# Patient Record
Sex: Female | Born: 1985
Health system: Southern US, Community
[De-identification: ages and names within clinical notes are randomized; demographics above are authoritative.]

## PROBLEM LIST (undated history)

## (undated) DIAGNOSIS — R0602 Shortness of breath: Secondary | ICD-10-CM

## (undated) DIAGNOSIS — I471 Supraventricular tachycardia, unspecified: Secondary | ICD-10-CM

## (undated) HISTORY — DX: Shortness of breath: R06.02

## (undated) HISTORY — DX: Supraventricular tachycardia, unspecified: I47.10

## (undated) HISTORY — PX: TONSILLECTOMY AND ADENOIDECTOMY: SHX28

## (undated) HISTORY — DX: Supraventricular tachycardia: I47.1

---

## 2017-10-11 DIAGNOSIS — E282 Polycystic ovarian syndrome: Secondary | ICD-10-CM | POA: Insufficient documentation

## 2018-05-22 DIAGNOSIS — N644 Mastodynia: Secondary | ICD-10-CM | POA: Diagnosis not present

## 2019-01-01 ENCOUNTER — Other Ambulatory Visit: Payer: Self-pay

## 2019-01-01 ENCOUNTER — Emergency Department (HOSPITAL_COMMUNITY)
Admission: EM | Admit: 2019-01-01 | Discharge: 2019-01-01 | Disposition: A | Discharge status: Home / Self Care | Payer: 59 | Attending: Emergency Medicine | Admitting: Emergency Medicine

## 2019-01-01 DIAGNOSIS — Z3A01 Less than 8 weeks gestation of pregnancy: Secondary | ICD-10-CM | POA: Diagnosis not present

## 2019-01-01 DIAGNOSIS — R0602 Shortness of breath: Secondary | ICD-10-CM | POA: Insufficient documentation

## 2019-01-01 DIAGNOSIS — O26891 Other specified pregnancy related conditions, first trimester: Secondary | ICD-10-CM | POA: Insufficient documentation

## 2019-01-01 DIAGNOSIS — I471 Supraventricular tachycardia: Secondary | ICD-10-CM | POA: Insufficient documentation

## 2019-01-01 LAB — CBC WITH DIFFERENTIAL/PLATELET
Abs Immature Granulocytes: 0.07 10*3/uL (ref 0.00–0.07)
Basophils Absolute: 0.1 10*3/uL (ref 0.0–0.1)
Basophils Relative: 0 %
Eosinophils Absolute: 0.2 10*3/uL (ref 0.0–0.5)
Eosinophils Relative: 1 %
HCT: 40.6 % (ref 36.0–46.0)
Hemoglobin: 13.5 g/dL (ref 12.0–15.0)
Immature Granulocytes: 1 %
Lymphocytes Relative: 20 %
Lymphs Abs: 3 10*3/uL (ref 0.7–4.0)
MCH: 28.9 pg (ref 26.0–34.0)
MCHC: 33.3 g/dL (ref 30.0–36.0)
MCV: 86.9 fL (ref 80.0–100.0)
Monocytes Absolute: 1 10*3/uL (ref 0.1–1.0)
Monocytes Relative: 7 %
Neutro Abs: 10.3 10*3/uL — ABNORMAL HIGH (ref 1.7–7.7)
Neutrophils Relative %: 71 %
Platelets: 354 10*3/uL (ref 150–400)
RBC: 4.67 MIL/uL (ref 3.87–5.11)
RDW: 12.1 % (ref 11.5–15.5)
WBC: 14.5 10*3/uL — ABNORMAL HIGH (ref 4.0–10.5)
nRBC: 0 % (ref 0.0–0.2)

## 2019-01-01 LAB — URINALYSIS, ROUTINE W REFLEX MICROSCOPIC
Bacteria, UA: NONE SEEN
Bilirubin Urine: NEGATIVE
Glucose, UA: NEGATIVE mg/dL
Hgb urine dipstick: NEGATIVE
Ketones, ur: NEGATIVE mg/dL
Nitrite: NEGATIVE
Protein, ur: NEGATIVE mg/dL
Specific Gravity, Urine: 1.012 (ref 1.005–1.030)
pH: 6 (ref 5.0–8.0)

## 2019-01-01 LAB — COMPREHENSIVE METABOLIC PANEL
ALT: 21 U/L (ref 0–44)
AST: 22 U/L (ref 15–41)
Albumin: 3.7 g/dL (ref 3.5–5.0)
Alkaline Phosphatase: 54 U/L (ref 38–126)
Anion gap: 11 (ref 5–15)
BUN: 11 mg/dL (ref 6–20)
CO2: 20 mmol/L — ABNORMAL LOW (ref 22–32)
Calcium: 9.2 mg/dL (ref 8.9–10.3)
Chloride: 107 mmol/L (ref 98–111)
Creatinine, Ser: 0.69 mg/dL (ref 0.44–1.00)
GFR calc Af Amer: >60 mL/min (ref >60)
GFR calc non Af Amer: >60 mL/min (ref >60)
Glucose, Bld: 149 mg/dL — ABNORMAL HIGH (ref 70–99)
Potassium: 3.5 mmol/L (ref 3.5–5.1)
Sodium: 138 mmol/L (ref 135–145)
Total Bilirubin: 0.1 mg/dL — ABNORMAL LOW (ref 0.3–1.2)
Total Protein: 7 g/dL (ref 6.5–8.1)

## 2019-01-01 LAB — TSH: TSH: 1.154 u[IU]/mL (ref 0.350–4.500)

## 2019-01-01 LAB — LACTIC ACID, PLASMA: Lactic Acid, Venous: 1.9 mmol/L (ref 0.5–1.9)

## 2019-01-01 LAB — T4, FREE: Free T4: 0.87 ng/dL (ref 0.61–1.12)

## 2019-01-01 LAB — MAGNESIUM: Magnesium: 2.1 mg/dL (ref 1.7–2.4)

## 2019-01-01 MED ORDER — METOPROLOL TARTRATE 25 MG PO TABS: 25.0000 mg | ORAL_TABLET | Freq: Two times a day (BID) | ORAL | 0 refills | Status: DC | PRN

## 2019-01-01 MED ORDER — LACTATED RINGERS IV BOLUS
1000.0000 mL | Freq: Once | INTRAVENOUS | Status: AC
  Administered 2019-01-01: 15:00:00 1000 mL via INTRAVENOUS

## 2019-01-01 NOTE — ED Notes (Signed)
Paged cards per MD

## 2019-01-01 NOTE — ED Provider Notes (Addendum)
[facility] EMERGENCY DEPARTMENT Provider Note   CSN: [id number] Arrival date & time: 01/01/19  1447     History   Chief Complaint Chief Complaint  Patient presents with  . Tachycardia    HPI Kiara Reed is a 33 y.o. female.     The history is provided by the patient.  Palpitations Palpitations quality:  Regular Onset quality:  Sudden Duration:  2 hours Timing:  Constant Progression:  Unchanged Chronicity:  Recurrent Context: not anxiety, not caffeine and not exercise   Relieved by:  Nothing Worsened by:  Nothing Ineffective treatments:  Valsalva, bed rest and breathing exercises Associated symptoms: shortness of breath   Associated symptoms: no cough, no nausea and no vomiting   Associated symptoms comment:  Shoulder pain that occurs with her SVT previously Risk factors comment:  [redacted] weeks pregnant, on progesterone (no IVF), sees a fertility clinic  Patient is symptomatic likely SVT and saw cardiologist as a teenager.  She has not seen a cardiologist since she became an adult.  She is [redacted] weeks pregnant, pregnancy confirmed by ultrasound at fertility clinic.  No past medical history on file.  There are no active problems to display for this patient.   ** The histories are not reviewed yet. Please review them in the "History" navigator section and refresh this SmartLink.   OB History   No obstetric history on file.      Home Medications    Prior to Admission medications   Medication Sig Start Date End Date Taking? Authorizing Provider  metoprolol tartrate (LOPRESSOR) 25 MG tablet Take 1 tablet (25 mg total) by mouth 2 (two) times daily as needed (Palpitations unrelieved by  baring down and valsalva maneuver). 01/01/19   Chester Holstein, MD    Family History No family history on file.  Social History Social History   Tobacco Use  . Smoking status: Not on file  Substance Use Topics  . Alcohol use: Not on file  . Drug use: Not on  file     Allergies   Latex   Review of Systems Review of Systems  Constitutional: Negative for fever.  Respiratory: Positive for shortness of breath. Negative for cough.   Cardiovascular: Positive for palpitations.  Gastrointestinal: Negative for abdominal pain, nausea and vomiting.  Genitourinary: Negative for vaginal bleeding, vaginal discharge and vaginal pain.  Skin: Negative for rash and wound.  All other systems reviewed and are negative.    Physical Exam Updated Vital Signs BP 102/68   Pulse 85   Resp (!) 25   Ht 5\' 2"  (1.575 m)   Wt 90.7 kg   SpO2 97%   BMI 36.58 kg/m   Physical Exam Vitals signs and nursing note reviewed.  Constitutional:      General: She is not in acute distress.    Appearance: She is well-developed.  HENT:     Head: Normocephalic and atraumatic.  Eyes:     Conjunctiva/sclera: Conjunctivae normal.  Neck:     Musculoskeletal: Neck supple.  Cardiovascular:     Rate and Rhythm: Regular rhythm. Tachycardia present.     Pulses: Normal pulses.     Heart sounds: No murmur.  Pulmonary:     Effort: Pulmonary effort is normal. No respiratory distress.     Breath sounds: Normal breath sounds. No wheezing.  Abdominal:     General: Bowel sounds are normal. There is no distension.     Palpations: Abdomen is soft.     Tenderness: There  is no abdominal tenderness.     Comments: Slightly gravid abdomen  Musculoskeletal: Normal range of motion.  Skin:    General: Skin is warm and dry.  Neurological:     General: No focal deficit present.     Mental Status: She is alert and oriented to person, place, and time.      ED Treatments / Results  Labs (all labs ordered are listed, but only abnormal results are displayed) Labs Reviewed  CBC WITH DIFFERENTIAL/PLATELET - Abnormal; Notable for the following components:      Result Value   WBC 14.5 (*)    Neutro Abs 10.3 (*)    All other components within normal limits  COMPREHENSIVE METABOLIC  PANEL - Abnormal; Notable for the following components:   CO2 20 (*)    Glucose, Bld 149 (*)    Total Bilirubin 0.1 (*)    All other components within normal limits  URINALYSIS, ROUTINE W REFLEX MICROSCOPIC - Abnormal; Notable for the following components:   Leukocytes,Ua TRACE (*)    All other components within normal limits  LACTIC ACID, PLASMA  TSH  T4, FREE  MAGNESIUM    EKG EKG Interpretation  Date/Time:  Wednesday January 01 2019 15:07:28 EST Ventricular Rate:  100 PR Interval:    QRS Duration: 81 QT Interval:  301 QTC Calculation: 389 R Axis:   65 Text Interpretation: Sinus tachycardia Atrial premature complex Low voltage, precordial leads Minimal ST depression, diffuse leads svt resolved Otherwise no significant change Confirmed by Melene PlanFloyd, Dan [phone]([id number]) on 01/01/2019 3:52:46 PM   Radiology No results found.  Procedures .Cardioversion  Date/Time: 01/01/2019 3:00 PM Performed by: Chester HolsteinVaithi, Edmonia Gonser, MD Authorized by: Melene PlanFloyd, Dan, DO   Consent:    Consent obtained:  Verbal   Consent given by:  Patient   Alternatives discussed:  No treatment and rate-control medication Pre-procedure details:    Cardioversion basis:  Emergent   Rhythm:  Supraventricular tachycardia   Electrode placement:  Anterior-posterior Patient sedated: No Post-procedure details:    Patient status:  Awake   Patient tolerance of procedure:  Tolerated well, no immediate complications Comments:     Patient was converted back to sinus rhythm with valsalva x2. Placed on 2L [place] O2 for preoxygenation and weaned without difficulty.     (including critical care time)  Medications Ordered in ED Medications  lactated ringers bolus 1,000 mL (0 mLs Intravenous Stopped 01/01/19 1742)     Initial Impression / Assessment and Plan / ED Course  I have reviewed the triage vital signs and the nursing notes.  Pertinent labs & imaging results that were available during my care of the patient were reviewed  by me and considered in my medical decision making (see chart for details).        Kiara Reed is a 33 y.o. female G1, P0 who is [redacted] weeks pregnant presenting today for recurrent SVT that has always previously broken with Valsalva.  Today, breathing exercise and Valsalva did not help.  She has never taken medications for this.  Within minutes of arrival, patient was given 2 L nasal cannula for symptomatic relief, IV obtained, Valsalva done x2 with immediate decrease in heartbeat from 182 to 90.  EKGs obtained before and after.  Initial EKG had shown SVT, EKGs after procedure.  Sinus rhythm.  Labs and differential diagnosis sent for electrolyte abnormality, anemia, sepsis, UTI.  LR bolus ordered.  Patient has remained in sinus rhythm on the emergency department.  There was no significant  electrolyte abnormality, anemia, signs of infection, UTI.  Cardiology consulted, and they recommend metoprolol as needed for tachycardia that is unresolved with Valsalva at home.  They will arrange follow-up.  Hydration encouraged.  Follow-up recommended with her OB/GYN as needed.  Care of patient discussed with supervising attending.  Final Clinical Impressions(s) / ED Diagnoses   Final diagnoses:  SVT (supraventricular tachycardia) [facility])    ED Discharge Orders         Ordered    metoprolol tartrate (LOPRESSOR) 25 MG tablet  2 times daily PRN     01/01/19 1731           Julianne Rice, MD 01/01/19 2036    Deno Etienne, DO 01/01/19 2216

## 2019-01-01 NOTE — ED Notes (Signed)
Pt verbalized understanding of discharge instructions and denies any further questions at this time. Mother present at bedside. Pt provided with a wheelchair.

## 2019-01-01 NOTE — ED Notes (Signed)
Pt is [redacted] weeks pregnant- through fertility clinic, has had 2 ultrasounds to confirm--  Has a hx of svt, episode this weekend Saturday-- laid on floor and was able to get it to stop, today it started at 1309-- has not stopped -- rate over 180 for 2 hours.

## 2019-01-02 ENCOUNTER — Encounter: Payer: Self-pay | Admitting: Cardiology

## 2019-01-02 ENCOUNTER — Ambulatory Visit (INDEPENDENT_AMBULATORY_CARE_PROVIDER_SITE_OTHER): Payer: 59 | Admitting: Cardiology

## 2019-01-02 VITALS — BP 112/62 | HR 88 | Ht 62.0 in | Wt 207.8 lb

## 2019-01-02 DIAGNOSIS — I471 Supraventricular tachycardia: Secondary | ICD-10-CM | POA: Diagnosis not present

## 2019-01-02 NOTE — Patient Instructions (Signed)
Medication Instructions:  - Your physician recommends that you continue on your current medications as directed. Please refer to the Current Medication list given to you today.  *If you need a refill on your cardiac medications before your next appointment, please call your pharmacy*  Lab Work: - none ordered If you have labs (blood work) drawn today and your tests are completely normal, you will receive your results only by: Marland Kitchen MyChart Message (if you have MyChart) OR . A paper copy in the mail If you have any lab test that is abnormal or we need to change your treatment, we will call you to review the results.  Testing/Procedures: - Your physician has requested that you have an echocardiogram. Echocardiography is a painless test that uses sound waves to create images of your heart. It provides your doctor with information about the size and shape of your heart and how well your heart's chambers and valves are working. This procedure takes approximately one hour. There are no restrictions for this procedure.  - You have been referred to: Dr. Cristopher Peru for SVT - Dr. Tanna Furry scheduler from our [place] office will be in touch with you regarding an appointment.  Follow-Up: At [facility], you and your health needs are our priority.  As part of our continuing mission to provide you with exceptional heart care, we have created designated Provider Care Teams.  These Care Teams include your primary Cardiologist (physician) and Advanced Practice Providers (APPs -  Physician Assistants and Nurse Practitioners) who all work together to provide you with the care you need, when you need it.  Your next appointment:   6 months  The format for your next appointment:   In Person  Provider:   Kate Sable, MD  Other Instructions - n/a   Echocardiogram An echocardiogram is a procedure that uses painless sound waves (ultrasound) to produce an image of the heart. Images from an  echocardiogram can provide important information about:  Signs of coronary artery disease (CAD).  Aneurysm detection. An aneurysm is a weak or damaged part of an artery wall that bulges out from the normal force of blood pumping through the body.  Heart size and shape. Changes in the size or shape of the heart can be associated with certain conditions, including heart failure, aneurysm, and CAD.  Heart muscle function.  Heart valve function.  Signs of a past heart attack.  Fluid buildup around the heart.  Thickening of the heart muscle.  A tumor or infectious growth around the heart valves. Tell a health care provider about:  Any allergies you have.  All medicines you are taking, including vitamins, herbs, eye drops, creams, and over-the-counter medicines.  Any blood disorders you have.  Any surgeries you have had.  Any medical conditions you have.  Whether you are pregnant or may be pregnant. What are the risks? Generally, this is a safe procedure. However, problems may occur, including:  Allergic reaction to dye (contrast) that may be used during the procedure. What happens before the procedure? No specific preparation is needed. You may eat and drink normally. What happens during the procedure?   An IV tube may be inserted into one of your veins.  You may receive contrast through this tube. A contrast is an injection that improves the quality of the pictures from your heart.  A gel will be applied to your chest.  A wand-like tool (transducer) will be moved over your chest. The gel will help to transmit the  sound waves from the transducer.  The sound waves will harmlessly bounce off of your heart to allow the heart images to be captured in real-time motion. The images will be recorded on a computer. The procedure may vary among health care providers and hospitals. What happens after the procedure?  You may return to your normal, everyday life, including diet,  activities, and medicines, unless your health care provider tells you not to do that. Summary  An echocardiogram is a procedure that uses painless sound waves (ultrasound) to produce an image of the heart.  Images from an echocardiogram can provide important information about the size and shape of your heart, heart muscle function, heart valve function, and fluid buildup around your heart.  You do not need to do anything to prepare before this procedure. You may eat and drink normally.  After the echocardiogram is completed, you may return to your normal, everyday life, unless your health care provider tells you not to do that. This information is not intended to replace advice given to you by your health care provider. Make sure you discuss any questions you have with your health care provider. Document Released: 02/04/2000 Document Revised: 05/30/2018 Document Reviewed: 03/11/2016 Elsevier Patient Education  2020 [employer].

## 2019-01-02 NOTE — Progress Notes (Signed)
Cardiology Office Note:    Date:  01/02/2019   ID:  Kiara GainsGayla Craun, DOB [birth year], MRN [id number]  PCP:  Carren Rangarter, Danielle, PA-C  Cardiologist:  Debbe OdeaBrian Agbor-Etang, MD  Electrophysiologist:  None   Referring MD: Carren Rangarter, Danielle, PA-C   Chief Complaint  Patient presents with  . Other    ED follow up. Patient c./o SOB.  Meds reviewed verbally with patient.    Kiara Reed is a 33 y.o. female who is being seen today for the evaluation of palpitations at the request of Carren RangCarter, Danielle, [place].  History of Present Illness:    Kiara GainsGayla Wray is a 33 y.o. female with no significant past medical history, currently [redacted] weeks pregnant, who presents due to palpitations.  Patient [place] [facility] yesterday when she noticed palpitations.  She usually tries to take a deep breath with improvement in symptoms but this time symptoms persisted for over 2 hours.  [facility]  Her initial EKG showed SVT with heart rate of 200.  Valsalva maneuvers were performed with improvement in heart rate to about 90 bpm.  Rhythm was sinus.  Electrolytes were normal, there was no signs of infection or, anemia.  Repeat EKG showed sinus with heart rate 100 bpm.  She was prescribed Lopressor to take as needed for palpitations.  Patient states having history of palpitations starting at age 33.  She saw cardiology in the past and was told she would grow out of it.  Her prior symptoms [facility]  Symptoms lasted a couple of minutes.  Usually she takes a deep breath with improvement in symptoms.  History reviewed. No pertinent past medical history.  Past Surgical History:  Procedure Laterality Date  . TONSILLECTOMY AND ADENOIDECTOMY      Current Medications: Current Meds  Medication Sig  . metoprolol tartrate (LOPRESSOR) 25 MG tablet Take 1 tablet (25 mg total) by mouth 2 (two) times daily as needed (Palpitations  unrelieved by  baring down and valsalva maneuver).     Allergies:   Latex   Social History   Socioeconomic History  . Marital status: Married    Spouse name: Not on file  . Number of children: Not on file  . Years of education: Not on file  . Highest education level: Not on file  Occupational History  . Not on file  Social Needs  . Financial resource strain: Not on file  . Food insecurity    Worry: Not on file    Inability: Not on file  . Transportation needs    Medical: Not on file    Non-medical: Not on file  Tobacco Use  . Smoking status: Never Smoker  . Smokeless tobacco: Never Used  Substance and Sexual Activity  . Alcohol use: Never    Frequency: Never  . Drug use: Never  . Sexual activity: Not on file  Lifestyle  . Physical activity    Days per week: Not on file    Minutes per session: Not on file  . Stress: Not on file  Relationships  . Social Musicianconnections    Talks on phone: Not on file    Gets together: Not on file    Attends religious service: Not on file    Active member of club or organization: Not on file    Attends meetings of clubs or organizations: Not on file    Relationship status:  Not on file  Other Topics Concern  . Not on file  Social History Narrative  . Not on file     Family History: The patient denies any family history of cardiac disease.  ROS:   Please see the history of present illness.     All other systems reviewed and are negative.  EKGs/Labs/Other Studies Reviewed:    The following studies were reviewed today: EKG in the emergency room reviewed by myself showed SVT with heart rate 200.  EKG:  EKG is  ordered today.  The ekg ordered today demonstrates normal sinus rhythm, heart rate 88.  Recent Labs: 01/01/2019: ALT 21; BUN 11; Creatinine, Ser 0.69; Hemoglobin 13.5; Magnesium 2.1; Platelets 354; Potassium 3.5; Sodium 138; TSH 1.154  Recent Lipid Panel No results found for: CHOL, TRIG, HDL, CHOLHDL, VLDL, LDLCALC,  LDLDIRECT  Physical Exam:    VS:  BP 112/62 (BP Location: Left Arm, Patient Position: Sitting, Cuff Size: Normal)   Pulse 88   Ht  (1.575 m)   Wt 207 lb 12 oz (94.2 kg)   BMI 38.00 kg/m     Wt Readings from Last 3 Encounters:  01/02/19 207 lb 12 oz (94.2 kg)  01/01/19 200 lb (90.7 kg)     GEN:  Well nourished, well developed in no acute distress HEENT: Normal NECK: No JVD; No carotid bruits LYMPHATICS: No lymphadenopathy CARDIAC: RRR, no murmurs, rubs, gallops RESPIRATORY:  Clear to auscultation without rales, wheezing or rhonchi  ABDOMEN: Soft, non-tender, non-distended MUSCULOSKELETAL:  No edema; No deformity  SKIN: Warm and dry NEUROLOGIC:  Alert and oriented x 3 PSYCHIATRIC:  Normal affect   ASSESSMENT:   33 year old female, [redacted] weeks pregnant presenting with symptoms of palpitations.  Found to have SVT which resolved with Valsalva maneuver.  1. SVT (supraventricular tachycardia) (HCC)    PLAN:    In order of problems listed above:  1. We will get an echocardiogram to rule out any structural abnormalities.  Patient educated on Valsalva maneuvers to help whenever palpitations arise.  Due to risk of beta-blocker crossing placenta, risk of fetal growth restriction, I advised patient to take metoprolol only if symptoms persist after for Valsalva  maneuvers are performed.  We will refer patient to electrophysiology for possible ablation consideration after her child is born.  She is due in June 2021.  Total encounter time more than 60 minutes  Greater than 50% was spent in counseling and coordination of care with the patient  This note was generated in part or whole with voice recognition software. Voice recognition is usually quite accurate but there are transcription errors that can and very often do occur. I apologize for any typographical errors that were not detected and corrected.  Medication Adjustments/Labs and Tests Ordered: Current medicines are reviewed at  length with the patient today.  Concerns regarding medicines are outlined above.  Orders Placed This Encounter  Procedures  . Ambulatory referral to Cardiac Electrophysiology  . EKG 12-Lead  . ECHOCARDIOGRAM COMPLETE   No orders of the defined types were placed in this encounter.   Patient Instructions  Medication Instructions:  - Your physician recommends that you continue on your current medications as directed. Please refer to the Current Medication list given to you today.  *If you need a refill on your cardiac medications before your next appointment, please call your pharmacy*  Lab Work: - none ordered If you have labs (blood work) drawn today and your tests are completely normal, you will receive  your results only by: Marland Kitchen MyChart Message (if you have MyChart) OR . A paper copy in the mail If you have any lab test that is abnormal or we need to change your treatment, we will call you to review the results.  Testing/Procedures: - Your physician has requested that you have an echocardiogram. Echocardiography is a painless test that uses sound waves to create images of your heart. It provides your doctor with information about the size and shape of your heart and how well your heart's chambers and valves are working. This procedure takes approximately one hour. There are no restrictions for this procedure.  - You have been referred to: Dr. Lewayne Bunting for SVT - Dr. Lubertha Basque scheduler from our [place] office will be in touch with you regarding an appointment.  Follow-Up: At [facility], you and your health needs are our priority.  As part of our continuing mission to provide you with exceptional heart care, we have created designated Provider Care Teams.  These Care Teams include your primary Cardiologist (physician) and Advanced Practice Providers (APPs -  Physician Assistants and Nurse Practitioners) who all work together to provide you with the care you need, when you need it.   Your next appointment:   6 months  The format for your next appointment:   In Person  Provider:   Debbe Odea, MD  Other Instructions - n/a   Echocardiogram An echocardiogram is a procedure that uses painless sound waves (ultrasound) to produce an image of the heart. Images from an echocardiogram can provide important information about:  Signs of coronary artery disease (CAD).  Aneurysm detection. An aneurysm is a weak or damaged part of an artery wall that bulges out from the normal force of blood pumping through the body.  Heart size and shape. Changes in the size or shape of the heart can be associated with certain conditions, including heart failure, aneurysm, and CAD.  Heart muscle function.  Heart valve function.  Signs of a past heart attack.  Fluid buildup around the heart.  Thickening of the heart muscle.  A tumor or infectious growth around the heart valves. Tell a health care provider about:  Any allergies you have.  All medicines you are taking, including vitamins, herbs, eye drops, creams, and over-the-counter medicines.  Any blood disorders you have.  Any surgeries you have had.  Any medical conditions you have.  Whether you are pregnant or may be pregnant. What are the risks? Generally, this is a safe procedure. However, problems may occur, including:  Allergic reaction to dye (contrast) that may be used during the procedure. What happens before the procedure? No specific preparation is needed. You may eat and drink normally. What happens during the procedure?   An IV tube may be inserted into one of your veins.  You may receive contrast through this tube. A contrast is an injection that improves the quality of the pictures from your heart.  A gel will be applied to your chest.  A wand-like tool (transducer) will be moved over your chest. The gel will help to transmit the sound waves from the transducer.  The sound waves will  harmlessly bounce off of your heart to allow the heart images to be captured in real-time motion. The images will be recorded on a computer. The procedure may vary among health care providers and hospitals. What happens after the procedure?  You may return to your normal, everyday life, including diet, activities, and medicines, unless your health  care provider tells you not to do that. Summary  An echocardiogram is a procedure that uses painless sound waves (ultrasound) to produce an image of the heart.  Images from an echocardiogram can provide important information about the size and shape of your heart, heart muscle function, heart valve function, and fluid buildup around your heart.  You do not need to do anything to prepare before this procedure. You may eat and drink normally.  After the echocardiogram is completed, you may return to your normal, everyday life, unless your health care provider tells you not to do that. This information is not intended to replace advice given to you by your health care provider. Make sure you discuss any questions you have with your health care provider. Document Released: 02/04/2000 Document Revised: 05/30/2018 Document Reviewed: 03/11/2016 Elsevier Patient Education  2020 [place], Kate Sable, MD  01/02/2019 10:57 AM    [place]

## 2019-01-10 ENCOUNTER — Encounter: Payer: Self-pay | Admitting: Obstetrics & Gynecology

## 2019-01-10 ENCOUNTER — Other Ambulatory Visit (HOSPITAL_COMMUNITY)
Admission: RE | Admit: 2019-01-10 | Discharge: 2019-01-10 | Disposition: A | Discharge status: Home / Self Care | Payer: 59 | Source: Ambulatory Visit | Attending: Obstetrics & Gynecology | Admitting: Obstetrics & Gynecology

## 2019-01-10 ENCOUNTER — Ambulatory Visit (INDEPENDENT_AMBULATORY_CARE_PROVIDER_SITE_OTHER): Payer: 59 | Admitting: Obstetrics & Gynecology

## 2019-01-10 ENCOUNTER — Other Ambulatory Visit: Payer: Self-pay

## 2019-01-10 VITALS — BP 120/70 | Wt 208.0 lb

## 2019-01-10 DIAGNOSIS — Z3490 Encounter for supervision of normal pregnancy, unspecified, unspecified trimester: Secondary | ICD-10-CM

## 2019-01-10 DIAGNOSIS — Z124 Encounter for screening for malignant neoplasm of cervix: Secondary | ICD-10-CM | POA: Insufficient documentation

## 2019-01-10 DIAGNOSIS — Z3A09 9 weeks gestation of pregnancy: Secondary | ICD-10-CM

## 2019-01-10 DIAGNOSIS — Z3491 Encounter for supervision of normal pregnancy, unspecified, first trimester: Secondary | ICD-10-CM

## 2019-01-10 DIAGNOSIS — Z1379 Encounter for other screening for genetic and chromosomal anomalies: Secondary | ICD-10-CM

## 2019-01-10 NOTE — Progress Notes (Signed)
01/10/2019   Chief Complaint: Missed period  Transfer of Care Patient: [facility]  History of Present Illness: Kiara Reed is a 33 y.o. G1P0 [redacted]w[redacted]d based on Patient's last menstrual period was 11/02/2018. with an Estimated Date of Delivery: 08/09/19, with the above CC.   Her periods were: rare, oligomenorrhea related to PCOS.    Pt underwent 2 rounds of Letrozole for fertility w success She was using no method when she conceived.  She has Negative signs or symptoms of nausea/vomiting of pregnancy. She has Negative signs or symptoms of miscarriage or preterm labor She identifies Negative Zika risk factors for her and her partner On any different medications around the time she conceived/early pregnancy: No  History of varicella: Yes   ROS: A 12-point review of systems was performed and negative, except as stated in the above HPI.  OBGYN History: As per HPI. OB History  Gravida Para Term Preterm AB Living  1            SAB TAB Ectopic Multiple Live Births               # Outcome Date GA Lbr Len/2nd Weight Sex Delivery Anes PTL Lv  1 Current             Any issues with any prior pregnancies: not applicable Any prior children are healthy, doing well, without any problems or issues: not applicable History of pap smears: Yes. Last pap smear 2019. Abnormal: no  History of STIs: No   Past Medical History: Past Medical History:  Diagnosis Date  . SVT (supraventricular tachycardia) (HCC)     Past Surgical History: Past Surgical History:  Procedure Laterality Date  . TONSILLECTOMY AND ADENOIDECTOMY      Family History:  Family History  Problem Relation Age of Onset  . Cancer Mother   . Cervical cancer Mother   . Bladder Cancer Mother    She denies any female cancers, bleeding or blood clotting disorders.  She denies any history of mental retardation, birth defects or genetic disorders in her or the FOB's history  Social History:  Social History   Socioeconomic  History  . Marital status: Married    Spouse name: Not on file  . Number of children: Not on file  . Years of education: Not on file  . Highest education level: Not on file  Occupational History  . Not on file  Social Needs  . Financial resource strain: Not on file  . Food insecurity    Worry: Not on file    Inability: Not on file  . Transportation needs    Medical: Not on file    Non-medical: Not on file  Tobacco Use  . Smoking status: Never Smoker  . Smokeless tobacco: Never Used  Substance and Sexual Activity  . Alcohol use: Never    Frequency: Never  . Drug use: Never  . Sexual activity: Yes    Birth control/protection: None  Lifestyle  . Physical activity    Days per week: Not on file    Minutes per session: Not on file  . Stress: Not on file  Relationships  . Social Musician on phone: Not on file    Gets together: Not on file    Attends religious service: Not on file    Active member of club or organization: Not on file    Attends meetings of clubs or organizations: Not on file    Relationship status: Not on  file  . Intimate partner violence    Fear of current or ex partner: Not on file    Emotionally abused: Not on file    Physically abused: Not on file    Forced sexual activity: Not on file  Other Topics Concern  . Not on file  Social History Narrative  . Not on file   Any pets in the household: no  Allergy: Allergies  Allergen Reactions  . Latex Rash    Current Outpatient Medications:  Current Outpatient Medications:  .  metoprolol tartrate (LOPRESSOR) 25 MG tablet, Take 1 tablet (25 mg total) by mouth 2 (two) times daily as needed (Palpitations unrelieved by  baring down and valsalva maneuver)., Disp: 30 tablet, Rfl: 0   Physical Exam:   BP 120/70   Wt 208 lb (94.3 kg)   LMP 11/02/2018   BMI 38.04 kg/m  Body mass index is 38.04 kg/m. Constitutional: Well nourished, well developed female in no acute distress.  Neck:  Supple,  normal appearance, and no thyromegaly  Cardiovascular: S1, S2 normal, no murmur, rub or gallop, regular rate and rhythm Respiratory:  Clear to auscultation bilateral. Normal respiratory effort Abdomen: positive bowel sounds and no masses, hernias; diffusely non tender to palpation, non distended Breasts: breasts appear normal, no suspicious masses, no skin or nipple changes or axillary nodes. Neuro/Psych:  Normal mood and affect.  Skin:  Warm and dry.  Lymphatic:  No inguinal lymphadenopathy.   Pelvic exam: is not limited by body habitus EGBUS: within normal limits, Vagina: within normal limits and with no blood in the vault, Cervix: normal appearing cervix without discharge or lesions, closed/long/high, Uterus:  enlarged: 10 weeks, and Adnexa:  no mass, fullness, tenderness  Assessment: Kiara Reed is a 33 y.o. G1P0 [redacted]w[redacted]d based on Patient's last menstrual period was 11/02/2018. with an Estimated Date of Delivery: 08/09/19,  for prenatal care.  Plan:  1) Avoid alcoholic beverages. 2) Patient encouraged not to smoke.  3) Discontinue the use of all non-medicinal drugs and chemicals.  4) Take prenatal vitamins daily.  5) Seatbelt use advised 6) Nutrition, food safety (fish, cheese advisories, and high nitrite foods) and exercise discussed. 7) Hospital and practice style delivering at [facility] discussed  8) Patient is asked about travel to areas at risk for the [place] virus, and counseled to avoid travel and exposure to mosquitoes or sexual partners who may have themselves been exposed to the virus. Testing is discussed, and will be ordered as appropriate.  9) [facility]birth classes at [facility] advised 10) Genetic Screening, such as with 1st Trimester Screening, cell free fetal DNA, AFP testing, and Ultrasound, as well as with amniocentesis and CVS as appropriate, is discussed with patient. She plans to have genetic testing this pregnancy. 11) SVT, Metoprolol prn per cardiology 12) [place] already done and w  established EDC; plan anatomy US 19 weeks  Problem list reviewed and updated.  Barnett Applebaum, MD, [facility], [facility] 01/10/2019  8:46 AM

## 2019-01-10 NOTE — Patient Instructions (Signed)
Commonly Asked Questions During Pregnancy  Cats: A parasite can be excreted in cat feces.  To avoid exposure you need to have another person empty the little box.  If you must empty the litter box you will need to wear gloves.  Wash your hands after handling your cat.  This parasite can also be found in raw or undercooked meat so this should also be avoided.  Colds, Sore Throats, Flu: Please check your medication sheet to see what you can take for symptoms.  If your symptoms are unrelieved by these medications please call the office.  Dental Work: Most any dental work your dentist recommends is permitted.  X-rays should only be taken during the first trimester if absolutely necessary.  Your abdomen should be shielded with a lead apron during all x-rays.  Please notify your provider prior to receiving any x-rays.  Novocaine is fine; gas is not recommended.  If your dentist requires a note from us prior to dental work please call the office and we will provide one for you.  Exercise: Exercise is an important part of staying healthy during your pregnancy.  You may continue most exercises you were accustomed to prior to pregnancy.  Later in your pregnancy you will most likely notice you have difficulty with activities requiring balance like riding a bicycle.  It is important that you listen to your body and avoid activities that put you at a higher risk of falling.  Adequate rest and staying well hydrated are a must!  If you have questions about the safety of specific activities ask your provider.    Exposure to Children with illness: Try to avoid obvious exposure; report any symptoms to us when noted,  If you have chicken pos, red measles or mumps, you should be immune to these diseases.   Please do not take any vaccines while pregnant unless you have checked with your OB provider.  Fetal Movement: After 28 weeks we recommend you do "kick counts" twice daily.  Lie or sit down in a calm quiet environment and  count your baby movements "kicks".  You should feel your baby at least 10 times per hour.  If you have not felt 10 kicks within the first hour get up, walk around and have something sweet to eat or drink then repeat for an additional hour.  If count remains less than 10 per hour notify your provider.  Fumigating: Follow your pest control agent's advice as to how long to stay out of your home.  Ventilate the area well before re-entering.  Hemorrhoids:   Most over-the-counter preparations can be used during pregnancy.  Check your medication to see what is safe to use.  It is important to use a stool softener or fiber in your diet and to drink lots of liquids.  If hemorrhoids seem to be getting worse please call the office.   Hot Tubs:  Hot tubs Jacuzzis and saunas are not recommended while pregnant.  These increase your internal body temperature and should be avoided.  Intercourse:  Sexual intercourse is safe during pregnancy as long as you are comfortable, unless otherwise advised by your provider.  Spotting may occur after intercourse; report any bright red bleeding that is heavier than spotting.  Labor:  If you know that you are in labor, please go to the hospital.  If you are unsure, please call the office and let us help you decide what to do.  Lifting, straining, etc:  If your job requires heavy   lifting or straining please check with your provider for any limitations.  Generally, you should not lift items heavier than that you can lift simply with your hands and arms (no back muscles)  Painting:  Paint fumes do not harm your pregnancy, but may make you ill and should be avoided if possible.  Latex or water based paints have less odor than oils.  Use adequate ventilation while painting.  Permanents & Hair Color:  Chemicals in hair dyes are not recommended as they cause increase hair dryness which can increase hair loss during pregnancy.  " Highlighting" and permanents are allowed.  Dye may be  absorbed differently and permanents may not hold as well during pregnancy.  Sunbathing:  Use a sunscreen, as skin burns easily during pregnancy.  Drink plenty of fluids; avoid over heating.  Tanning Beds:  Because their possible side effects are still unknown, tanning beds are not recommended.  Ultrasound Scans:  Routine ultrasounds are performed at approximately 20 weeks.  You will be able to see your baby's general anatomy an if you would like to know the gender this can usually be determined as well.  If it is questionable when you conceived you may also receive an ultrasound early in your pregnancy for dating purposes.  Otherwise ultrasound exams are not routinely performed unless there is a medical necessity.  Although you can request a scan we ask that you pay for it when conducted because insurance does not cover " patient request" scans.  Work: If your pregnancy proceeds without complications you may work until your due date, unless your physician or employer advises otherwise.  Round Ligament Pain/Pelvic Discomfort:  Sharp, shooting pains not associated with bleeding are fairly common, usually occurring in the second trimester of pregnancy.  They tend to be worse when standing up or when you remain standing for long periods of time.  These are the result of pressure of certain pelvic ligaments called "round ligaments".  Rest, Tylenol and heat seem to be the most effective relief.  As the womb and fetus grow, they rise out of the pelvis and the discomfort improves.  Please notify the office if your pain seems different than that described.  It may represent a more serious condition.  First Trimester of Pregnancy The first trimester of pregnancy is from week 1 until the end of week 13 (months 1 through 3). A week after a sperm fertilizes an egg, the egg will implant on the wall of the uterus. This embryo will begin to develop into a baby. Genes from you and your partner will form the baby. The  female genes will determine whether the baby will be a boy or a girl. At 6-8 weeks, the eyes and face will be formed, and the heartbeat can be seen on ultrasound. At the end of 12 weeks, all the baby's organs will be formed. Now that you are pregnant, you will want to do everything you can to have a healthy baby. Two of the most important things are to get good prenatal care and to follow your health care provider's instructions. Prenatal care is all the medical care you receive before the baby's birth. This care will help prevent, find, and treat any problems during the pregnancy and childbirth. Body changes during your first trimester Your body goes through many changes during pregnancy. The changes vary from woman to woman.  You may gain or lose a couple of pounds at first.  You may feel sick to your stomach (  nauseous) and you may throw up (vomit). If the vomiting is uncontrollable, call your health care provider.  You may tire easily.  You may develop headaches that can be relieved by medicines. All medicines should be approved by your health care provider.  You may urinate more often. Painful urination may mean you have a bladder infection.  You may develop heartburn as a result of your pregnancy.  You may develop constipation because certain hormones are causing the muscles that push stool through your intestines to slow down.  You may develop hemorrhoids or swollen veins (varicose veins).  Your breasts may begin to grow larger and become tender. Your nipples may stick out more, and the tissue that surrounds them (areola) may become darker.  Your gums may bleed and may be sensitive to brushing and flossing.  Dark spots or blotches (chloasma, mask of pregnancy) may develop on your face. This will likely fade after the baby is born.  Your menstrual periods will stop.  You may have a loss of appetite.  You may develop cravings for certain kinds of food.  You may have changes in your  emotions from day to day, such as being excited to be pregnant or being concerned that something may go wrong with the pregnancy and baby.  You may have more vivid and strange dreams.  You may have changes in your hair. These can include thickening of your hair, rapid growth, and changes in texture. Some women also have hair loss during or after pregnancy, or hair that feels dry or thin. Your hair will most likely return to normal after your baby is born. What to expect at prenatal visits During a routine prenatal visit:  You will be weighed to make sure you and the baby are growing normally.  Your blood pressure will be taken.  Your abdomen will be measured to track your baby's growth.  The fetal heartbeat will be listened to between weeks 10 and 14 of your pregnancy.  Test results from any previous visits will be discussed. Your health care provider may ask you:  How you are feeling.  If you are feeling the baby move.  If you have had any abnormal symptoms, such as leaking fluid, bleeding, severe headaches, or abdominal cramping.  If you are using any tobacco products, including cigarettes, chewing tobacco, and electronic cigarettes.  If you have any questions. Other tests that may be performed during your first trimester include:  Blood tests to find your blood type and to check for the presence of any previous infections. The tests will also be used to check for low iron levels (anemia) and protein on red blood cells (Rh antibodies). Depending on your risk factors, or if you previously had diabetes during pregnancy, you may have tests to check for high blood sugar that affects pregnant women (gestational diabetes).  Urine tests to check for infections, diabetes, or protein in the urine.  An ultrasound to confirm the proper growth and development of the baby.  Fetal screens for spinal cord problems (spina bifida) and Down syndrome.  HIV (human immunodeficiency virus) testing.  Routine prenatal testing includes screening for HIV, unless you choose not to have this test.  You may need other tests to make sure you and the baby are doing well. Follow these instructions at home: Medicines  Follow your health care provider's instructions regarding medicine use. Specific medicines may be either safe or unsafe to take during pregnancy.  Take a prenatal vitamin that contains at   least 600 micrograms (mcg) of folic acid.  If you develop constipation, try taking a stool softener if your health care provider approves. Eating and drinking   Eat a balanced diet that includes fresh fruits and vegetables, whole grains, good sources of protein such as meat, eggs, or tofu, and low-fat dairy. Your health care provider will help you determine the amount of weight gain that is right for you.  Avoid raw meat and uncooked cheese. These carry germs that can cause birth defects in the baby.  Eating four or five small meals rather than three large meals a day may help relieve nausea and vomiting. If you start to feel nauseous, eating a few soda crackers can be helpful. Drinking liquids between meals, instead of during meals, also seems to help ease nausea and vomiting.  Limit foods that are high in fat and processed sugars, such as fried and sweet foods.  To prevent constipation: ? Eat foods that are high in fiber, such as fresh fruits and vegetables, whole grains, and beans. ? Drink enough fluid to keep your urine clear or pale yellow. Activity  Exercise only as directed by your health care provider. Most women can continue their usual exercise routine during pregnancy. Try to exercise for 30 minutes at least 5 days a week. Exercising will help you: ? Control your weight. ? Stay in shape. ? Be prepared for labor and delivery.  Experiencing pain or cramping in the lower abdomen or lower back is a good sign that you should stop exercising. Check with your health care provider before  continuing with normal exercises.  Try to avoid standing for long periods of time. Move your legs often if you must stand in one place for a long time.  Avoid heavy lifting.  Wear low-heeled shoes and practice good posture.  You may continue to have sex unless your health care provider tells you not to. Relieving pain and discomfort  Wear a good support bra to relieve breast tenderness.  Take warm sitz baths to soothe any pain or discomfort caused by hemorrhoids. Use hemorrhoid cream if your health care provider approves.  Rest with your legs elevated if you have leg cramps or low back pain.  If you develop varicose veins in your legs, wear support hose. Elevate your feet for 15 minutes, 3-4 times a day. Limit salt in your diet. Prenatal care  Schedule your prenatal visits by the twelfth week of pregnancy. They are usually scheduled monthly at first, then more often in the last 2 months before delivery.  Write down your questions. Take them to your prenatal visits.  Keep all your prenatal visits as told by your health care provider. This is important. Safety  Wear your seat belt at all times when driving.  Make a list of emergency phone numbers, including numbers for family, friends, the hospital, and police and fire departments. General instructions  Ask your health care provider for a referral to a local prenatal education class. Begin classes no later than the beginning of month 6 of your pregnancy.  Ask for help if you have counseling or nutritional needs during pregnancy. Your health care provider can offer advice or refer you to specialists for help with various needs.  Do not use hot tubs, steam rooms, or saunas.  Do not douche or use tampons or scented sanitary pads.  Do not cross your legs for long periods of time.  Avoid cat litter boxes and soil used by cats. These carry germs   that can cause birth defects in the baby and possibly loss of the fetus by miscarriage  or stillbirth.  Avoid all smoking, herbs, alcohol, and medicines not prescribed by your health care provider. Chemicals in these products affect the formation and growth of the baby.  Do not use any products that contain nicotine or tobacco, such as cigarettes and e-cigarettes. If you need help quitting, ask your health care provider. You may receive counseling support and other resources to help you quit.  Schedule a dentist appointment. At home, brush your teeth with a soft toothbrush and be gentle when you floss. Contact a health care provider if:  You have dizziness.  You have mild pelvic cramps, pelvic pressure, or nagging pain in the abdominal area.  You have persistent nausea, vomiting, or diarrhea.  You have a bad smelling vaginal discharge.  You have pain when you urinate.  You notice increased swelling in your face, hands, legs, or ankles.  You are exposed to fifth disease or chickenpox.  You are exposed to German measles (rubella) and have never had it. Get help right away if:  You have a fever.  You are leaking fluid from your vagina.  You have spotting or bleeding from your vagina.  You have severe abdominal cramping or pain.  You have rapid weight gain or loss.  You vomit blood or material that looks like coffee grounds.  You develop a severe headache.  You have shortness of breath.  You have any kind of trauma, such as from a fall or a car accident. Summary  The first trimester of pregnancy is from week 1 until the end of week 13 (months 1 through 3).  Your body goes through many changes during pregnancy. The changes vary from woman to woman.  You will have routine prenatal visits. During those visits, your health care provider will examine you, discuss any test results you may have, and talk with you about how you are feeling. This information is not intended to replace advice given to you by your health care provider. Make sure you discuss any  questions you have with your health care provider. Document Released: 01/31/2001 Document Revised: 01/19/2017 Document Reviewed: 01/19/2016 Elsevier Patient Education  2020 [employer]    COVID-19 and Your Pregnancy FAQ  How can I prevent infection with COVID-19 during my pregnancy? Social distancing is key. Please limit any interactions in public. Try and work from home if possible. Frequently wash your hands after touching possibly contaminated surfaces. Avoid touching your face.  Minimize trips to the store. Consider online ordering when possible.   Should I wear a mask? YES. It is recommended by the CDC that all people wear a cloth mask or facial covering in public. You should wear a mask to your visits in the office. This will help reduce transmission as well as your risk or acquiring COVID-19. New studies are showing that even asymptomatic individuals can spread the virus from talking.   Where can I get a mask? [place] and the city of [place] are partnering to provide masks to community members. You can pick up a mask from several locations. This website also has instructions about how to make a mask by sewing or without sewing by using a t-shirt or bandana.  https://www.[place].gov/i-want-to/learn-about/covid-19-information-and-updates/covid-19-face-mask-project  Studies have shown that if you were a tube or nylon stocking from pantyhose over a cloth mask it makes the cloth mask almost as effective as a N95 mask.  https://www.npr.org/sections/goatsandsoda/2018/06/12/840146830/adding-a-nylon-stocking-layer-could-boost-protection-from-cloth-masks-study-find  What are   the symptoms of COVID-19? Fever (greater than 100.4 F), dry cough, shortness of breath.  Am I more at risk for COVID-19 since I am pregnant? There is not currently data showing that pregnant women are more adversely impacted by COVID-19 than the general population. However, we know that pregnant women  tend to have worse respiratory complications from similar diseases such as the flu and SARS and for this reason should be considered an at-risk population.  What do I do if I am experiencing the symptoms of COVID-19? Testing is being limited because of test availability. If you are experiencing symptoms you should quarantine yourself, and the members of your family, for at least 2 weeks at home.   Please visit this website for more information: https://www.cdc.gov/coronavirus/2019-ncov/if-you-are-sick/steps-when-sick.html  When should I go to the Emergency Room? Please go to the emergency room if you are experiencing ANY of these symptoms*:  1.    Difficulty breathing or shortness of breath 2.    Persistent pain or pressure in the chest 3.    Confusion or difficulty being aroused (or awakened) 4.    Bluish lips or face  *This list is not all inclusive. Please consult our office for any other symptoms that are severe or concerning.  What do I do if I am having difficulty breathing? You should go to the Emergency Room for evaluation. At this time they have a tent set up for evaluating patients with COVID-19 symptoms.   How will my prenatal care be different because of the COVID-19 pandemic? It has been recommended to reduce the frequency of face-to-face visits and use resources such as telephone and virtual visits when possible. Using a scale, blood pressure machine and fetal doppler at home can further help reduce face-to-face visits. You will be provided with additional information on this topic.  We ask that you come to your visits alone to minimize potential exposures to  COVID-19.  How can I receive childbirth education? At this time in-person classes have been cancelled. You can register for online childbirth education, breastfeeding, and newborn care classes.  Please visit:  [email or web address] for more information  How will my hospital birth experience be different? The  hospital is currently limiting visitors. This means that while you are in labor you can only have one person at the hospital with you. Additional family members will not be allowed to wait in the building or outside your room. Your one support person can be the father of the baby, a relative, a doula, or a friend. Once one support person is designated that person will wear a band. This band cannot be shared with multiple people.  Nitrous Gas is not being offered for pain relief since the tubing and filter for the machine can not be sanitized in a way to guarantee prevention of transmission of COVID-19.  Nasal cannula use of oxygen for fetal indications has also been discontinued.  Currently a clear plastic sheet is being hung between mom and the delivering provider during pushing and delivery to help prevent transmission of COVID-19.      How long will I stay in the hospital for after giving birth? It is also recommended that discharge home be expedited during the COVID-19 outbreak. This means staying for 1 day after a vaginal delivery and 2 days after a cesarean section. Patients who need to stay longer for medical reasons are allowed to do so, but the goal will be for expedited discharge home.   What if I have COVID-19   and I am in labor? We ask that you wear a mask while on labor and delivery. We will try and accommodate you being placed in a room that is capable of filtering the air. Please call ahead if you are in labor and on your way to the hospital. The phone number for labor and delivery at  [facility] is [phone].  If I have COVID-19 when my baby is born how can I prevent my baby from contracting COVID-19? This is an issue that will have to be discussed on a case-by-case basis. Current recommendations suggest providing separate isolation rooms for both the mother and new infant as well as limiting visitors. However, there are practical challenges to this  recommendation. The situation will assuredly change and decisions will be influenced by the desires of the mother and availability of space.  Some suggestions are the use of a curtain or physical barrier between mom and infant, hand hygiene, mom wearing a mask, or 6 feet of spacing between a mom and infant.   Can I breastfeed during the COVID-19 pandemic?   Yes, breastfeeding is encouraged.  Can I breastfeed if I have COVID-19? Yes. Covid-19 has not been found in breast milk. This means you cannot give COVID-19 to your child through breast milk. Breast feeding will also help pass antibodies to fight infection to your baby.   What precautions should I take when breastfeeding if I have COVID-19? If a mother and newborn do room-in and the mother wishes to feed at the breast, she should put on a facemask and practice hand hygiene before each feeding.  What precautions should I take when pumping if I have COVID-19? Prior to expressing breast milk, mothers should practice hand hygiene. After each pumping session, all parts that come into contact with breast milk should be thoroughly washed and the entire pump should be appropriately disinfected per the manufacturer's instructions. This expressed breast milk should be fed to the newborn by a healthy caregiver.  What if I am pregnant and work in healthcare? Based on limited data regarding COVID-19 and pregnancy, ACOG currently does not propose creating additional restrictions on pregnant health care personnel because of COVID-19 alone. Pregnant women do not appear to be at higher risk of severe disease related to COVID-19. Pregnant health care personnel should follow CDC risk assessment and infection control guidelines for health care personnel exposed to patients with suspected or confirmed COVID-19. Adherence to recommended infection prevention and control practices is an important part of protecting all health care personnel in health care settings.     Information on COVID-19 in pregnancy is very limited; however, facilities may want to consider limiting exposure of pregnant health care personnel to patients with confirmed or suspected COVID-19 infection, especially during higher-risk procedures (eg, aerosol-generating procedures), if feasible, based on staffing availability.    

## 2019-01-11 LAB — RPR+RH+ABO+RUB AB+AB SCR+CB...
Antibody Screen: NEGATIVE
HIV Screen 4th Generation wRfx: NONREACTIVE
Hematocrit: 36 % (ref 34.0–46.6)
Hemoglobin: 12.2 g/dL (ref 11.1–15.9)
Hepatitis B Surface Ag: NEGATIVE
MCH: 28.6 pg (ref 26.6–33.0)
MCHC: 33.9 g/dL (ref 31.5–35.7)
MCV: 84 fL (ref 79–97)
Platelets: 321 10*3/uL (ref 150–450)
RBC: 4.27 x10E6/uL (ref 3.77–5.28)
RDW: 11.9 % (ref 11.7–15.4)
RPR Ser Ql: NONREACTIVE
Rh Factor: POSITIVE
Rubella Antibodies, IGG: 1.35 index (ref >0.99)
Varicella zoster IgG: 810 index (ref >165)
WBC: 10.6 10*3/uL (ref 3.4–10.8)

## 2019-01-12 LAB — URINE CULTURE

## 2019-01-13 LAB — CYTOLOGY - PAP: Diagnosis: NEGATIVE

## 2019-01-16 LAB — MATERNIT21 PLUS CORE+SCA
Fetal Fraction: 5
Monosomy X (Turner Syndrome): NOT DETECTED
Result (T21): NEGATIVE
Trisomy 13 (Patau syndrome): NEGATIVE
Trisomy 18 (Edwards syndrome): NEGATIVE
Trisomy 21 (Down syndrome): NEGATIVE
XXX (Triple X Syndrome): NOT DETECTED
XXY (Klinefelter Syndrome): NOT DETECTED
XYY (Jacobs Syndrome): NOT DETECTED

## 2019-01-18 NOTE — Progress Notes (Signed)
The results of your recent genetic screening test for [facility] Syndrome and similar conditions is negative or low risk; this is very good news.   D/W pt by telephone Does not want to know gender  Barnett Applebaum, MD, [facility], [facility] 01/18/2019  12:45 PM

## 2019-01-20 ENCOUNTER — Telehealth: Payer: Self-pay

## 2019-01-20 NOTE — Telephone Encounter (Signed)
Unable to leave message VM full. 

## 2019-01-20 NOTE — Telephone Encounter (Signed)
Spoke w/patient. Verbal given for baby Gender (Female) per her request.

## 2019-01-20 NOTE — Telephone Encounter (Signed)
Pt rcvd call from [facility] that Genetic results were in which included gender. Patient calling to inquire so she can surprise her husband. SW#[phone]

## 2019-01-23 ENCOUNTER — Encounter: Payer: Self-pay | Admitting: Internal Medicine

## 2019-01-23 ENCOUNTER — Ambulatory Visit (INDEPENDENT_AMBULATORY_CARE_PROVIDER_SITE_OTHER): Payer: 59 | Admitting: Internal Medicine

## 2019-01-23 ENCOUNTER — Other Ambulatory Visit: Payer: Self-pay

## 2019-01-23 DIAGNOSIS — R002 Palpitations: Secondary | ICD-10-CM

## 2019-01-23 DIAGNOSIS — I471 Supraventricular tachycardia, unspecified: Secondary | ICD-10-CM | POA: Insufficient documentation

## 2019-01-23 HISTORY — DX: Palpitations: R00.2

## 2019-01-23 MED ORDER — METOPROLOL TARTRATE 25 MG PO TABS: ORAL_TABLET | ORAL | 3 refills | Status: DC

## 2019-01-23 NOTE — Patient Instructions (Addendum)
Medication Instructions:  Your physician has recommended you make the following change in your medication:   1.  Starting on 02/14/2019-  Take metoprolol tartrate 25 mg- Take one tablet by mouth daily.  You may take an additional tablet as needed for breakthrough heart racing.  NO more than 2 tablets in 24 hours.  Labwork: None ordered.  Testing/Procedures: None ordered.  Follow-Up: Your physician wants you to follow-up in: February with Dr. Lovena Le.   Any Other Special Instructions Will Be Listed Below (If Applicable).  If you need a refill on your cardiac medications before your next appointment, please call your pharmacy.

## 2019-01-23 NOTE — Progress Notes (Signed)
HPI Ms. Widener is referred today by Dr. Azucena Cecil for evaluation of SVT. She is a pleasant 33 yo woman with longstanding palpitations dating back to high school. These spells were fairly infrequent and controlled until she becam pregnant about 3 months ago. She has had multiple episodes since, typically starting with a cough or a sneeze. The patient has had documented short RP tachy at 200/min. She has sob and palpitations but has not experienced syncope. No chest pain. The spells stop and start suddenly with valsalva maneuvers.  Allergies  Allergen Reactions  . Latex Rash     Current Outpatient Medications  Medication Sig Dispense Refill  . metoprolol tartrate (LOPRESSOR) 25 MG tablet Take 1 tablet (25 mg total) by mouth 2 (two) times daily as needed (Palpitations unrelieved by  baring down and valsalva maneuver). 30 tablet 0  . Multiple Vitamin (MULTI-VITAMIN DAILY PO) Take 1 tablet by mouth daily.     No current facility-administered medications for this visit.      Past Medical History:  Diagnosis Date  . SOB (shortness of breath)   . SVT (supraventricular tachycardia) (HCC)     ROS:   All systems reviewed and negative except as noted in the HPI.   Past Surgical History:  Procedure Laterality Date  . TONSILLECTOMY AND ADENOIDECTOMY       Family History  Problem Relation Age of Onset  . Cancer Mother   . Cervical cancer Mother   . Bladder Cancer Mother      Social History   Socioeconomic History  . Marital status: Married    Spouse name: Not on file  . Number of children: Not on file  . Years of education: Not on file  . Highest education level: Not on file  Occupational History  . Not on file  Social Needs  . Financial resource strain: Not on file  . Food insecurity    Worry: Not on file    Inability: Not on file  . Transportation needs    Medical: Not on file    Non-medical: Not on file  Tobacco Use  . Smoking status: Never Smoker  .  Smokeless tobacco: Never Used  Substance and Sexual Activity  . Alcohol use: Never    Frequency: Never  . Drug use: Never  . Sexual activity: Yes    Birth control/protection: None  Lifestyle  . Physical activity    Days per week: Not on file    Minutes per session: Not on file  . Stress: Not on file  Relationships  . Social Musician on phone: Not on file    Gets together: Not on file    Attends religious service: Not on file    Active member of club or organization: Not on file    Attends meetings of clubs or organizations: Not on file    Relationship status: Not on file  . Intimate partner violence    Fear of current or ex partner: Not on file    Emotionally abused: Not on file    Physically abused: Not on file    Forced sexual activity: Not on file  Other Topics Concern  . Not on file  Social History Narrative  . Not on file     BP 106/70   Pulse 73   Ht 5\' 2"  (1.575 m)   Wt 207 lb 9.6 oz (94.2 kg)   LMP 11/02/2018   SpO2 98%   BMI 37.97  kg/m   Physical Exam:  Well appearing NAD HEENT: Unremarkable Neck:  No JVD, no thyromegally Lymphatics:  No adenopathy Back:  No CVA tenderness Lungs:  Clear with no wheezes HEART:  Regular rate rhythm, no murmurs, no rubs, no clicks Abd:  soft, positive bowel sounds, no organomegally, no rebound, no guarding Ext:  2 plus pulses, no edema, no cyanosis, no clubbing Skin:  No rashes no nodules Neuro:  CN II through XII intact, motor grossly intact  EKG - nsr with no pre-excitation.    Assess/Plan: 1. SVT during pregnancy - she is about [redacted] weeks gestation and I have recommended she hold off on metoprolol for another 3 weeks unless she has symptomatic SVT. Valsalva maneuvers were reviewed as well. After the end of December she can start taking metoprolol 25 mg daily, and an additional dose as needed for any break through symptoms.  2. Pregnancy - I discussed the effect of metoprolol as well as SVT on her  pregnancy. If she decides to get pregnant again, then catheter ablation prior to her next pregnancy would be warranted.   Mikle Bosworth.D.

## 2019-01-27 ENCOUNTER — Other Ambulatory Visit: Payer: Self-pay

## 2019-01-27 ENCOUNTER — Encounter: Payer: Self-pay | Admitting: Advanced Practice Midwife

## 2019-01-27 ENCOUNTER — Ambulatory Visit (INDEPENDENT_AMBULATORY_CARE_PROVIDER_SITE_OTHER): Payer: 59 | Admitting: Advanced Practice Midwife

## 2019-01-27 DIAGNOSIS — Z3401 Encounter for supervision of normal first pregnancy, first trimester: Secondary | ICD-10-CM

## 2019-01-27 DIAGNOSIS — Z3A12 12 weeks gestation of pregnancy: Secondary | ICD-10-CM

## 2019-01-27 NOTE — Progress Notes (Signed)
    Routine Prenatal Care Visit- Virtual Visit  Subjective   Virtual Visit via Telephone Note  I connected with Kiara Reed on 01/27/19 at  4:30 PM EST by telephone and verified that I am speaking with the correct person using two identifiers.   I discussed the limitations, risks, security and privacy concerns of performing an evaluation and management service by telephone and the availability of in person appointments. I also discussed with the patient that there may be a patient responsible charge related to this service. The patient expressed understanding and agreed to proceed.  The patient was at home I spoke with the patient from my  Office phone The names of people involved in this encounter were: Kiara Reed and myself Rod Can, CNM  Kiara Reed is a 33 y.o. G1P0 at [redacted]w[redacted]d being seen today for ongoing prenatal care.  She is currently monitored for the following issues for this high-risk pregnancy and has Palpitations; SVT (supraventricular tachycardia) ([place]); and PCOS (polycystic ovarian syndrome) on their problem list.  ----------------------------------------------------------------------------------- Patient reports doing well. She is anxious to hear fetal heart tones. I offered to have her come in sooner than 4 weeks and she declines at this time.    . Vag. Bleeding: None.   . Denies leaking of fluid.  ----------------------------------------------------------------------------------- The following portions of the patient's history were reviewed and updated as appropriate: allergies, current medications, past family history, past medical history, past social history, past surgical history and problem list. Problem list updated.   Objective  Last menstrual period 11/02/2018. Pregravid weight 205 lb (93 kg) Total Weight Gain 3 lb (1.361 kg) Urinalysis:      Fetal Status:           Physical Exam could not be performed. Because of the COVID-19 outbreak this visit was  performed over the phone and not in person.   Assessment   33 y.o. G1P0 at [redacted]w[redacted]d by  08/09/2019, by Last Menstrual Period presenting for routine prenatal visit  Plan   pregnancy1 Problems (from 11/02/18 to present)    No problems associated with this episode.       Gestational age appropriate obstetric precautions including but not limited to vaginal bleeding, contractions, leaking of fluid and fetal movement were reviewed in detail with the patient.     Follow Up Instructions:   I discussed the assessment and treatment plan with the patient. The patient was provided an opportunity to ask questions and all were answered. The patient agreed with the plan and demonstrated an understanding of the instructions.   The patient was advised to call back or seek an in-person evaluation if the symptoms worsen or if the condition fails to improve as anticipated.  I provided 14 minutes of non-face-to-face time during this encounter.  Return in about 4 weeks (around 02/24/2019) for rob in office.   Rod Can, [facility] 01/27/2019, 4:58 PM

## 2019-01-27 NOTE — Progress Notes (Signed)
[place] telephone visit- no concerns

## 2019-01-31 ENCOUNTER — Ambulatory Visit (INDEPENDENT_AMBULATORY_CARE_PROVIDER_SITE_OTHER): Payer: 59

## 2019-01-31 ENCOUNTER — Other Ambulatory Visit: Payer: Self-pay

## 2019-01-31 DIAGNOSIS — I471 Supraventricular tachycardia: Secondary | ICD-10-CM | POA: Diagnosis not present

## 2019-02-05 ENCOUNTER — Encounter: Payer: Self-pay | Admitting: Obstetrics and Gynecology

## 2019-02-21 NOTE — L&D Delivery Note (Signed)
°  PREOPERATIVE DIAGNOSES: 1. Term pregnancy at 40wks and 4days Gestational Age 32. Repetitive variable decelerations   POSTOPERATIVE DIAGNOSES: 1. Term pregnancy at 40wks and 4days Gestational Age 32. Live, Viable female infant 3. Repetitive variable decelerations   OPERATION PERFORMED: Vacuum-Assisted Vaginal Delivery  SURGEON: Adelene Idler, MD  ANESTHESIA:  Epidural  ESTIMATED BLOOD LOSS:639cc  FINDINGS: Delivered a female infant weight pending with Apgars 7 / 9 , three-vessel cord and normal placenta.   COMPLICATIONS: None  SITUATION:  The  fetal heart rate tracing was discussed with the patient and her partner, as were the delivery options including a Kiwi vacuum delivery. The pros and cons and the risks of the vacuum delivery were discussed in detail, as were the alternative approaches. The patient and her partner decided to proceed with a Kiwi vacuum delivery.   DESCRIPTION OF THE PROCEDURE:   The baby's head was confirmed to be in the cepahlic presentation with 100% effacement and +2 station. The perineum was injected with 1% lidocaine. The bladder was drained. The vacuum was placed and the correct placement in front of the posterior fontanelle was confirmed digitally. With the patient's next contraction, the vacuum was inflated and a gentle pressure was used to assist with maternal pushes to deliver the baby's head. In total there were two pulls and no pop-offs. The vacuum was released between contractions.  After delivery of the head, the vacuum was deflated and removed. There was a nuchal cord.The anterior right shoulder was delivered with gentle downward guidance followed by delivery of the posterior left shoulder with gentle upward guidance. The infant was placed on the maternal chest.  The cord was clamped x2 and cut. The infant was handed to the neonatalogist.   Pitocin was added to the patient's IV fluids. The placenta delivered spontaneously, was intact and had a  three-vessel cord. A vaginal inspection revealed a 3a perineal laceration. The laceration  was repaired with #2-0 and #3-0 Vicryl suture in a running fashion with local anesthesia.   At the end of the delivery mom and baby were recovering in stable condition.  Sponge, instrument, and needle counts were correct times two.   Adelene Idler MD [facility], [facility] 08/13/19 4:49 AM

## 2019-02-24 ENCOUNTER — Encounter: Payer: Self-pay | Admitting: Advanced Practice Midwife

## 2019-02-24 ENCOUNTER — Other Ambulatory Visit: Payer: Self-pay

## 2019-02-24 ENCOUNTER — Ambulatory Visit (INDEPENDENT_AMBULATORY_CARE_PROVIDER_SITE_OTHER): Payer: 59 | Admitting: Advanced Practice Midwife

## 2019-02-24 VITALS — BP 114/66 | Wt 211.0 lb

## 2019-02-24 DIAGNOSIS — Z3402 Encounter for supervision of normal first pregnancy, second trimester: Secondary | ICD-10-CM

## 2019-02-24 DIAGNOSIS — Z3400 Encounter for supervision of normal first pregnancy, unspecified trimester: Secondary | ICD-10-CM

## 2019-02-24 DIAGNOSIS — Z3A16 16 weeks gestation of pregnancy: Secondary | ICD-10-CM

## 2019-02-24 DIAGNOSIS — O0990 Supervision of high risk pregnancy, unspecified, unspecified trimester: Secondary | ICD-10-CM | POA: Insufficient documentation

## 2019-02-24 NOTE — Patient Instructions (Signed)

## 2019-02-24 NOTE — Progress Notes (Signed)
No concerns.rj 

## 2019-02-24 NOTE — Addendum Note (Signed)
Addended by: Loran Senters D on: 02/24/2019 10:25 AM   Modules accepted: Orders

## 2019-02-24 NOTE — Progress Notes (Signed)
  Routine Prenatal Care Visit  Subjective  Kiara Reed is a 34 y.o. G1P0 at [redacted]w[redacted]d being seen today for ongoing prenatal care.  She is currently monitored for the following issues for this low-risk pregnancy and has Palpitations; SVT (supraventricular tachycardia) (HCC); PCOS (polycystic ovarian syndrome); and Supervision of normal first pregnancy on their problem list.  ----------------------------------------------------------------------------------- Patient reports no complaints.    . Vag. Bleeding: None.  Movement: Absent. Leaking Fluid denies.  ----------------------------------------------------------------------------------- The following portions of the patient's history were reviewed and updated as appropriate: allergies, current medications, past family history, past medical history, past social history, past surgical history and problem list. Problem list updated.  Objective  Blood pressure 114/66, weight 211 lb (95.7 kg), last menstrual period 11/02/2018. Pregravid weight 205 lb (93 kg) Total Weight Gain 6 lb (2.722 kg) Urinalysis: Urine Protein    Urine Glucose    Fetal Status: Fetal Heart Rate (bpm): 154   Movement: Absent     General:  Alert, oriented and cooperative. Patient is in no acute distress.  Skin: Skin is warm and dry. No rash noted.   Cardiovascular: Normal heart rate noted  Respiratory: Normal respiratory effort, no problems with respiration noted  Abdomen: Soft, gravid, appropriate for gestational age. Pain/Pressure: Absent     Pelvic:  Cervical exam deferred        Extremities: Normal range of motion.     Mental Status: Normal mood and affect. Normal behavior. Normal judgment and thought content.   Assessment   34 y.o. G1P0 at [redacted]w[redacted]d by  08/09/2019, by Last Menstrual Period presenting for routine prenatal visit  Plan   pregnancy1 Problems (from 11/02/18 to present)    Problem Noted Resolved   Supervision of normal first pregnancy 02/24/2019 by Tresea Mall, CNM No       Preterm labor symptoms and general obstetric precautions including but not limited to vaginal bleeding, contractions, leaking of fluid and fetal movement were reviewed in detail with the patient. Please refer to After Visit Summary for other counseling recommendations.   Return in about 4 weeks (around 03/24/2019) for anatomy scan and rob.  Tresea Mall, CNM 02/24/2019 10:19 AM

## 2019-03-24 ENCOUNTER — Ambulatory Visit (INDEPENDENT_AMBULATORY_CARE_PROVIDER_SITE_OTHER): Payer: 59 | Admitting: Obstetrics and Gynecology

## 2019-03-24 ENCOUNTER — Ambulatory Visit (INDEPENDENT_AMBULATORY_CARE_PROVIDER_SITE_OTHER): Payer: 59

## 2019-03-24 ENCOUNTER — Ambulatory Visit (INDEPENDENT_AMBULATORY_CARE_PROVIDER_SITE_OTHER): Payer: 59 | Admitting: Internal Medicine

## 2019-03-24 ENCOUNTER — Other Ambulatory Visit: Payer: Self-pay

## 2019-03-24 VITALS — BP 108/66 | Wt 213.0 lb

## 2019-03-24 VITALS — BP 102/58 | HR 80 | Ht 62.0 in | Wt 212.0 lb

## 2019-03-24 DIAGNOSIS — Q6689 Other  specified congenital deformities of feet: Secondary | ICD-10-CM

## 2019-03-24 DIAGNOSIS — R002 Palpitations: Secondary | ICD-10-CM | POA: Diagnosis not present

## 2019-03-24 DIAGNOSIS — O358XX0 Maternal care for other (suspected) fetal abnormality and damage, not applicable or unspecified: Secondary | ICD-10-CM

## 2019-03-24 DIAGNOSIS — I471 Supraventricular tachycardia: Secondary | ICD-10-CM

## 2019-03-24 DIAGNOSIS — Z3400 Encounter for supervision of normal first pregnancy, unspecified trimester: Secondary | ICD-10-CM

## 2019-03-24 DIAGNOSIS — Z3A20 20 weeks gestation of pregnancy: Secondary | ICD-10-CM

## 2019-03-24 DIAGNOSIS — O99210 Obesity complicating pregnancy, unspecified trimester: Secondary | ICD-10-CM | POA: Insufficient documentation

## 2019-03-24 DIAGNOSIS — Z363 Encounter for antenatal screening for malformations: Secondary | ICD-10-CM | POA: Diagnosis not present

## 2019-03-24 HISTORY — DX: Other specified congenital deformities of feet: Q66.89

## 2019-03-24 LAB — POCT URINALYSIS DIPSTICK OB
Glucose, UA: NEGATIVE
POC,PROTEIN,UA: NEGATIVE

## 2019-03-24 MED ORDER — METOPROLOL TARTRATE 25 MG PO TABS: ORAL_TABLET | ORAL | 3 refills | Status: DC

## 2019-03-24 NOTE — Progress Notes (Signed)
Routine Prenatal Care Visit  Subjective  Kiara Reed is a 34 y.o. G1P0 at [redacted]w[redacted]d being seen today for ongoing prenatal care.  She is currently monitored for the following issues for this low-risk pregnancy and has Palpitations; SVT (supraventricular tachycardia) (HCC); PCOS (polycystic ovarian syndrome); Supervision of normal first pregnancy; Congenital talipes equinus; and Maternal obesity, antepartum on their problem list.  ----------------------------------------------------------------------------------- Patient reports no complaints.   Contractions: Not present. Vag. Bleeding: None.  Movement: Absent. Denies leaking of fluid.  ----------------------------------------------------------------------------------- The following portions of the patient's history were reviewed and updated as appropriate: allergies, current medications, past family history, past medical history, past social history, past surgical history and problem list. Problem list updated.   Objective  Blood pressure 108/66, weight 213 lb (96.6 kg), last menstrual period 11/02/2018. Pregravid weight 205 lb (93 kg) Total Weight Gain 8 lb (3.629 kg)  Body mass index is 38.96 kg/m.  Urinalysis:      Fetal Status: Fetal Heart Rate (bpm): 145   Movement: Absent     General:  Alert, oriented and cooperative. Patient is in no acute distress.  Skin: Skin is warm and dry. No rash noted.   Cardiovascular: Normal heart rate noted  Respiratory: Normal respiratory effort, no problems with respiration noted  Abdomen: Soft, gravid, appropriate for gestational age. Pain/Pressure: Absent     Pelvic:  Cervical exam deferred        Extremities: Normal range of motion.     ental Status: Normal mood and affect. Normal behavior. Normal judgment and thought content.   US OB Comp + 14 Wk  Result Date: 03/24/2019 Patient Name: Keni Wafer DOB: 12/02/1985 MRN: [id number] ULTRASOUND REPORT Location: [facility] Date of Service:  03/24/2019 Indications:Anatomy Ultrasound Findings: Mason Jim intrauterine pregnancy is visualized with FHR at 146 BPM. Biometrics give an (U/S) Gestational age of [redacted]w[redacted]d and an (U/S) EDD of 08/08/2019; this correlates with the clinically established Estimated Date of Delivery: 08/09/19 Fetal presentation is Cephalic. EFW: 353 g ( 12 oz ). Placenta: fundal. Grade: 0 AFI: subjectively normal. Anatomic survey is complete; Gender - female.  There is a right club foot seen. Normal heel position on the left. Impression: 1. 100w3d Viable Singleton Intrauterine pregnancy by U/S. 2. (U/S) EDD is consistent with Clinically established Estimated Date of Delivery: 08/09/19 . 3. There is a right club foot noted. Recommendations: 1.Clinical correlation with the patient's History and Physical Exam. Deanna Artis, RT  There is a singleton gestation with subjectively normal amniotic fluid volume. The fetal biometry correlates with established dating. Detailed evaluation of the fetal anatomy was performed.The fetal anatomical survey appears within normal limits within the resolution of ultrasound as described above.  Possible club foot on right vs positioning.   It must be noted that a normal ultrasound is unable to rule out fetal aneuploidy, subtle defects such as small ASD or VDS may also not be visible on imaging.  Congenital talipes equinovarus (clubfoot) is one of the most common congenital malformations; it affects 1-3 in 1000 live births and occurs twice as often in female fetuses.1,2 It canbe unilateral (30-40%) or bilateral (60-70%) and can be either an isolated malformation (50-70%) or complex and associated with other structural or genetic anomalies (30- 50%). [facility] Consult Series "Congenital talipes quinovarus" Vena Austria, MD, [facility], [facility] 03/24/2019, 3:26 PM     Assessment   34 y.o. G1P0 at [redacted]w[redacted]d by  08/09/2019, by Last Menstrual Period presenting for routine prenatal  visit  pregnancy1 Problems (from 11/02/18 to present)    Problem Noted Resolved   Congenital talipes equinus 03/24/2019 by Malachy Mood, MD No   Supervision of normal first pregnancy 02/24/2019 by Rod Can, CNM No   Overview Signed 03/24/2019  7:28 PM by Malachy Mood, MD    Clinic [facility] Prenatal Labs  Dating LMP 20 week [place] Blood type: A/Positive/-- (11/20 0850)   Genetic Screen NIPS:Normal XY Antibody:Negative (11/20 0850)  Anatomic [place]  Rubella: 1.35 (11/20 0850) Varicella: Immune  GTT Early:               Third trimester:  RPR: Non Reactive (11/20 0850)   Rhogam N/A HBsAg: Negative (11/20 0850)   TDaP vaccine                       Flu Shot: HIV: Non Reactive (11/20 0850)   Baby Food                                GBS:   Contraception  Pap: 11/20/202- NIL  CBB     CS/VBAC    Support Person Legrand Como              Plan   Gestational age appropriate obstetric precautions including but not limited to vaginal bleeding, contractions, leaking of fluid and fetal movement were reviewed in detail with the patient.    1) Congenital talipes equinus  MFM .  The patient does report that her father had to wear special shoes when he was little so potential family history.  Discussed that treatment would likely involve orthopedics and casting after delivery   Return in about 4 weeks (around 04/21/2019) for ROB.  Malachy Mood, MD, [facility], [facility] 03/24/2019, 3:45 PM

## 2019-03-24 NOTE — Patient Instructions (Addendum)

## 2019-03-24 NOTE — Progress Notes (Signed)
HPI Ms. Devivo returns today for followup. She is a pleasant 34 yo woman who is now in her second trimester of pregnancy. She has SVT at 200/min. She was started on low dose metoprolol and has improved. She takes 25 mg daily, rarely taking a second dose. She has not had syncope. She thinks that in the last 3 months she has had 3-4 episodes each lasting 5-10 minutes. She denies chest pain or sob.  Allergies  Allergen Reactions  . Latex Rash     Current Outpatient Medications  Medication Sig Dispense Refill  . metoprolol tartrate (LOPRESSOR) 25 MG tablet Starting after 02/14/2019-take one tablet by mouth daily.  May take an additional one tablet daily. 90 tablet 3  . Multiple Vitamin (MULTI-VITAMIN DAILY PO) Take 1 tablet by mouth daily.     No current facility-administered medications for this visit.     Past Medical History:  Diagnosis Date  . SOB (shortness of breath)   . SVT (supraventricular tachycardia) (HCC)     ROS:   All systems reviewed and negative except as noted in the HPI.   Past Surgical History:  Procedure Laterality Date  . TONSILLECTOMY AND ADENOIDECTOMY       Family History  Problem Relation Age of Onset  . Cervical cancer Mother   . Bladder Cancer Mother      Social History   Socioeconomic History  . Marital status: Married    Spouse name: Not on file  . Number of children: Not on file  . Years of education: Not on file  . Highest education level: Not on file  Occupational History  . Not on file  Tobacco Use  . Smoking status: Never Smoker  . Smokeless tobacco: Never Used  Substance and Sexual Activity  . Alcohol use: Never  . Drug use: Never  . Sexual activity: Yes    Birth control/protection: None  Other Topics Concern  . Not on file  Social History Narrative  . Not on file   Social Determinants of Health   Financial Resource Strain:   . Difficulty of Paying Living Expenses: Not on file  Food Insecurity:   . Worried  About Charity fundraiser in the Last Year: Not on file  . Ran Out of Food in the Last Year: Not on file  Transportation Needs:   . Lack of Transportation (Medical): Not on file  . Lack of Transportation (Non-Medical): Not on file  Physical Activity:   . Days of Exercise per Week: Not on file  . Minutes of Exercise per Session: Not on file  Stress:   . Feeling of Stress : Not on file  Social Connections:   . Frequency of Communication with Friends and Family: Not on file  . Frequency of Social Gatherings with Friends and Family: Not on file  . Attends Religious Services: Not on file  . Active Member of Clubs or Organizations: Not on file  . Attends Archivist Meetings: Not on file  . Marital Status: Not on file  Intimate Partner Violence:   . Fear of Current or Ex-Partner: Not on file  . Emotionally Abused: Not on file  . Physically Abused: Not on file  . Sexually Abused: Not on file     BP (!) 102/58   Pulse 80   Ht 5\' 2"  (1.575 m)   Wt 212 lb (96.2 kg)   LMP 11/02/2018   BMI 38.78 kg/m   Physical Exam:  Well appearing NAD HEENT: Unremarkable Neck:  No JVD, no thyromegally Lymphatics:  No adenopathy Back:  No CVA tenderness Lungs:  Clear with no wheezes HEART:  Regular rate rhythm, no murmurs, no rubs, no clicks Abd:  soft, positive bowel sounds, no organomegally, no rebound, no guarding Ext:  2 plus pulses, no edema, no cyanosis, no clubbing Skin:  No rashes no nodules Neuro:  CN II through XII intact, motor grossly intact  EKG -nsr with no pre-excitation   Assess/Plan: 1. SVT - she is doing well with nice control on beta blocker. I have asked her to stop her metoprolol 2 days before she delivers.  I discussed the risks/benefits of catheter ablation. I will see her back in 6 months and to discuss ablation prior to her trying to get pregnant again.  Leonia Reeves.D.

## 2019-03-24 NOTE — Progress Notes (Signed)
ROB Anatomy scan today 

## 2019-03-25 ENCOUNTER — Other Ambulatory Visit: Payer: Self-pay | Admitting: Obstetrics and Gynecology

## 2019-03-25 DIAGNOSIS — Z3400 Encounter for supervision of normal first pregnancy, unspecified trimester: Secondary | ICD-10-CM

## 2019-03-25 DIAGNOSIS — Q6689 Other  specified congenital deformities of feet: Secondary | ICD-10-CM

## 2019-04-07 ENCOUNTER — Other Ambulatory Visit: Payer: Self-pay

## 2019-04-07 ENCOUNTER — Ambulatory Visit
Admission: RE | Admit: 2019-04-07 | Discharge: 2019-04-07 | Disposition: A | Discharge status: Home / Self Care | Payer: 59 | Source: Ambulatory Visit | Attending: Obstetrics and Gynecology | Admitting: Obstetrics and Gynecology

## 2019-04-07 ENCOUNTER — Telehealth: Payer: Self-pay | Admitting: Obstetrics and Gynecology

## 2019-04-07 DIAGNOSIS — E669 Obesity, unspecified: Secondary | ICD-10-CM | POA: Diagnosis not present

## 2019-04-07 DIAGNOSIS — I471 Supraventricular tachycardia, unspecified: Secondary | ICD-10-CM | POA: Diagnosis present

## 2019-04-07 DIAGNOSIS — Z3A22 22 weeks gestation of pregnancy: Secondary | ICD-10-CM | POA: Diagnosis not present

## 2019-04-07 DIAGNOSIS — O99412 Diseases of the circulatory system complicating pregnancy, second trimester: Secondary | ICD-10-CM | POA: Diagnosis not present

## 2019-04-07 DIAGNOSIS — O99212 Obesity complicating pregnancy, second trimester: Secondary | ICD-10-CM | POA: Diagnosis not present

## 2019-04-07 DIAGNOSIS — Q6600 Congenital talipes equinovarus, unspecified foot: Secondary | ICD-10-CM | POA: Insufficient documentation

## 2019-04-07 DIAGNOSIS — Z79899 Other long term (current) drug therapy: Secondary | ICD-10-CM | POA: Insufficient documentation

## 2019-04-07 DIAGNOSIS — O99210 Obesity complicating pregnancy, unspecified trimester: Secondary | ICD-10-CM | POA: Diagnosis present

## 2019-04-07 DIAGNOSIS — R002 Palpitations: Secondary | ICD-10-CM | POA: Diagnosis present

## 2019-04-07 DIAGNOSIS — O359XX0 Maternal care for (suspected) fetal abnormality and damage, unspecified, not applicable or unspecified: Secondary | ICD-10-CM

## 2019-04-07 DIAGNOSIS — Q6689 Other  specified congenital deformities of feet: Secondary | ICD-10-CM

## 2019-04-07 DIAGNOSIS — Z3400 Encounter for supervision of normal first pregnancy, unspecified trimester: Secondary | ICD-10-CM

## 2019-04-07 DIAGNOSIS — O358XX0 Maternal care for other (suspected) fetal abnormality and damage, not applicable or unspecified: Secondary | ICD-10-CM | POA: Diagnosis not present

## 2019-04-07 NOTE — Telephone Encounter (Signed)
I called Ms. Lamp at the request of Dr. Gatha Mayer, who met with Ms. Chim and her husband today at [facility] regarding the finding of bilateral club feet on prenatal ultrasound, to offer telephone genetic counseling consultation regarding the ultrasound findings.  The patient declined at this time, as she felt like her questions were answered, but said that she would speak with her husband and call back to our clinic if she would like to schedule a consult.  We may be reached at [phone].  Wilburt Finlay, MS, CGC

## 2019-04-07 NOTE — Progress Notes (Signed)
[facility] Consultation   Chief Complaint: maternal SVT, fetal talipes equinovarus   HPI: Ms. Kiara [facility] is a 34 y.o. G1P0 at 53w2dMWF  who presents in consultation from  [facility] maternal SVT, elevated BMI  and fetal right clubfoot ( talipes equinovarus ) Here with Kiara Comoher husband.  They both met in the service.  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 These spells were fairly infrequent and controlled until she becam pregnant about 3 months ago. She has had multiple episodes since, typically starting with a cough or a sneeze....SVT at 200/min. She was started on low dose metoprolol and has improved. She takes 25 mg daily, rarely taking a second dose. She has not had syncope. She thinks that in the last 3 months she has had 3-4 episodes each lasting 5-10 minutes. She denies chest pain or sob. " He advised she stop her metoprolol  prior to delivery - (? Due to FHR effects) and advised ablation prior to next pregnancy    Club foot -l seen on anatomy scan at [facility]- right club foot confirmed today left appears normal today , postive family history appears isolated on scan, neg NIPT , female fetus   Obesity - BMI 39 pt reports gaining 5 lbs so far - being careful      Past Medical History: Patient  has a past medical history of SOB (shortness of breath) and SVT (supraventricular tachycardia) ([place].  Past Surgical History: She  has a past surgical history that includes Tonsillectomy and adenoidectomy.  Obstetric History:  OB History    Gravida  1   Para      Term      Preterm      AB      Living        SAB      TAB      Ectopic      Multiple      Live Births             Gynecologic History:  Patient's last menstrual period was 11/02/2018.    Medications:   Current Outpatient Medications:  .  metoprolol tartrate  (LOPRESSOR) 25 MG tablet, Starting after 02/14/2019-take one tablet by mouth daily.  May take an additional one tablet daily., Disp: 145 tablet, Rfl: 3 .  Multiple Vitamin (MULTI-VITAMIN DAILY PO), Take 1 tablet by mouth daily., Disp: , Rfl: .  Allergies: Patient is allergic to latex.  Social History: Patient  reports that she has never smoked. She has never used smokeless tobacco. She reports that she does not drink alcohol or use drugs.  Family History: family history includes Bipolar disorder in her mother; Bladder Cancer in her mother; Cervical cancer in her mother; Leukemia in her paternal aunt.  Review of Systems A full 12 point review of systems was negative or as noted in the History of Present Illness.  Physical Exam: LMP 11/02/2018  Height 62 inches weight 213 pounds temp 98.6 blood pressure 103/51 pulse 70 O2 saturation 96% p Asessement: IUP at 22w 2d Maternal SVT on metoprolol 25, patient takes an extra tablet if she notes palpitations.  In the past she has converted herself with Valsalva.  She has not had syncope - isolated  Right clubfoott talipes equinovarus on her earlier scan when I reviewed the images that appear to be bilateral.  On  today's scan the left foot appears normally oriented so appears to be isolated right clubfoot.  I did not see any evidence of open neural tube defect, amniotic band, or movement disorder.  Obesity - BMI 39  Plan: -As the clubfoot appears isolated, I did not recommend further genetic testing-I recommended evaluation in the neonatal period by a orthopedist with pediatric experience.  I gave her the name of Kiara Mask, RN at [facility] who can give her information on making an appointment with [facility] in the neonatal period.  I think it is safe for the infant to be delivered at [facility] with assessment by orthopedics in the neonatal period. -Continue metoprolol, I reviewed with the patient concerns with beta-blockers in  pregnancy.  There has been concern with lagging fetal growth with beta-blockers(atenolol has been associated with most cases and  some of the data is from pregnancies complicated by hypertension) there is also been concerned about exposure to beta-blockers and neonatal hypoglycemia that was not clear that that data was controlled for people with diabetes -- if persistent S VT despite metoprolol and Valsalva, adenosine is  A reasonable next agent with little risk to fetus  - in general anticoagulation not needed for SVT ( as would be for afib)  -If patient has difficulty with recurrent arrhythmia we would be happy to care for [facility] at [facility] otherwise I think it is reasonable for her to deliver at [facility].  Consider use of telemetry if patient is having issues with her arrhythmia in labor   Total time spent with the patient was 30 minutes with greater than 50% spent in counseling and coordination of care. We appreciate this interesting consult and will be happy to be involved in the ongoing care of Ms. Demuro in anyway her obstetricians desire.  [facility], [facility]

## 2019-04-21 ENCOUNTER — Ambulatory Visit (INDEPENDENT_AMBULATORY_CARE_PROVIDER_SITE_OTHER): Payer: 59 | Admitting: Obstetrics and Gynecology

## 2019-04-21 ENCOUNTER — Encounter: Payer: Self-pay | Admitting: Obstetrics and Gynecology

## 2019-04-21 ENCOUNTER — Other Ambulatory Visit: Payer: Self-pay

## 2019-04-21 VITALS — BP 104/60 | Temp 97.1°F | Wt 215.0 lb

## 2019-04-21 DIAGNOSIS — Z3A24 24 weeks gestation of pregnancy: Secondary | ICD-10-CM

## 2019-04-21 DIAGNOSIS — O99212 Obesity complicating pregnancy, second trimester: Secondary | ICD-10-CM

## 2019-04-21 DIAGNOSIS — Z3400 Encounter for supervision of normal first pregnancy, unspecified trimester: Secondary | ICD-10-CM

## 2019-04-21 DIAGNOSIS — O99210 Obesity complicating pregnancy, unspecified trimester: Secondary | ICD-10-CM

## 2019-04-21 DIAGNOSIS — Z3402 Encounter for supervision of normal first pregnancy, second trimester: Secondary | ICD-10-CM

## 2019-04-21 NOTE — Progress Notes (Signed)
Routine Prenatal Care Visit  Subjective  Kiara [facility] is a 34 y.o. G1P0 at [redacted]w[redacted]d being seen today for ongoing prenatal care.  She is currently monitored for the following issues for this low-risk pregnancy and has Palpitations; SVT (supraventricular tachycardia) ([place]); PCOS (polycystic ovarian syndrome); Supervision of normal first pregnancy; Congenital talipes equinus; and Maternal obesity, antepartum on their problem list.  ----------------------------------------------------------------------------------- Patient reports no complaints.  No symptoms of SVT at this time.  Contractions: Not present. Vag. Bleeding: None.  Movement: Present. Denies leaking of fluid.  ----------------------------------------------------------------------------------- The following portions of the patient's history were reviewed and updated as appropriate: allergies, current medications, past family history, past medical history, past social history, past surgical history and problem list. Problem list updated.   Objective  Blood pressure 104/60, temperature (!) 97.1 F (36.2 C), weight 215 lb (97.5 kg), last menstrual period 11/02/2018. Pregravid weight 205 lb (93 kg) Total Weight Gain 10 lb (4.536 kg) Urinalysis:      Fetal Status: Fetal Heart Rate (bpm): 145   Movement: Present     General:  Alert, oriented and cooperative. Patient is in no acute distress.  Skin: Skin is warm and dry. No rash noted.   Cardiovascular: Normal heart rate noted  Respiratory: Normal respiratory effort, no problems with respiration noted  Abdomen: Soft, gravid, appropriate for gestational age. Pain/Pressure: Absent     Pelvic:  Cervical exam deferred        Extremities: Normal range of motion.  Edema: None  Mental Status: Normal mood and affect. Normal behavior. Normal judgment and thought content.     Assessment   34 y.o. G1P0 at [redacted]w[redacted]d by  08/09/2019, by Last Menstrual Period presenting for routine prenatal  visit  Plan   pregnancy1 Problems (from 11/02/18 to present)    Problem Noted Resolved   Congenital talipes equinus 03/24/2019 by Malachy Mood, MD No   Maternal obesity, antepartum 03/24/2019 by Malachy Mood, MD No   Supervision of normal first pregnancy 02/24/2019 by Rod Can, CNM No   Overview Addendum 03/24/2019  7:30 PM by Malachy Mood, MD    [facility] Prenatal Labs  Dating LMP 20 week [place] Blood type: A/Positive/-- (11/20 0850)   Genetic Screen NIPS:Normal XY Antibody:Negative (11/20 0850)  Anatomic [place] Club foot Rubella: 1.35 (11/20 0850) Varicella: Immune  GTT Early:               Third trimester:  RPR: Non Reactive (11/20 0850)   Rhogam N/A HBsAg: Negative (11/20 0850)   TDaP vaccine                       Flu Shot: HIV: Non Reactive (11/20 0850)   Baby Food                                GBS:   Contraception  Pap: 11/20/202- NIL  CBB     CS/VBAC    Support Person Legrand Como              Gestational age appropriate obstetric precautions including but not limited to vaginal bleeding, contractions, leaking of fluid and fetal movement were reviewed in detail with the patient.    Has had follow up for clubfoot with [facility] and will be seeing him after delivery for infant. 28 week labs next visit.    Return in about 4 weeks (around 05/19/2019) for ROB and 1 GTT.  Natale Milch MD [facility], [facility] 04/21/2019, 9:41 AM

## 2019-05-01 ENCOUNTER — Other Ambulatory Visit: Payer: Self-pay | Admitting: Obstetrics and Gynecology

## 2019-05-01 ENCOUNTER — Ambulatory Visit
Admission: RE | Admit: 2019-05-01 | Discharge: 2019-05-01 | Disposition: A | Discharge status: Home / Self Care | Payer: 59 | Source: Ambulatory Visit | Attending: Obstetrics and Gynecology | Admitting: Obstetrics and Gynecology

## 2019-05-01 ENCOUNTER — Other Ambulatory Visit: Payer: Self-pay

## 2019-05-01 DIAGNOSIS — O358XX0 Maternal care for other (suspected) fetal abnormality and damage, not applicable or unspecified: Secondary | ICD-10-CM | POA: Insufficient documentation

## 2019-05-01 DIAGNOSIS — Q6689 Other  specified congenital deformities of feet: Secondary | ICD-10-CM

## 2019-05-01 DIAGNOSIS — Q6600 Congenital talipes equinovarus, unspecified foot: Secondary | ICD-10-CM

## 2019-05-01 DIAGNOSIS — Z3A25 25 weeks gestation of pregnancy: Secondary | ICD-10-CM | POA: Diagnosis not present

## 2019-05-01 DIAGNOSIS — Z3689 Encounter for other specified antenatal screening: Secondary | ICD-10-CM | POA: Diagnosis present

## 2019-05-01 DIAGNOSIS — O99210 Obesity complicating pregnancy, unspecified trimester: Secondary | ICD-10-CM

## 2019-05-01 DIAGNOSIS — Z3402 Encounter for supervision of normal first pregnancy, second trimester: Secondary | ICD-10-CM

## 2019-05-05 ENCOUNTER — Ambulatory Visit: Payer: 59

## 2019-05-12 ENCOUNTER — Ambulatory Visit: Payer: 59

## 2019-05-19 ENCOUNTER — Encounter: Payer: Self-pay | Admitting: Advanced Practice Midwife

## 2019-05-19 ENCOUNTER — Other Ambulatory Visit: Payer: 59

## 2019-05-19 ENCOUNTER — Other Ambulatory Visit: Payer: Self-pay | Admitting: Obstetrics and Gynecology

## 2019-05-19 ENCOUNTER — Ambulatory Visit (INDEPENDENT_AMBULATORY_CARE_PROVIDER_SITE_OTHER): Payer: 59 | Admitting: Advanced Practice Midwife

## 2019-05-19 ENCOUNTER — Other Ambulatory Visit: Payer: Self-pay

## 2019-05-19 VITALS — BP 120/80 | Wt 219.0 lb

## 2019-05-19 DIAGNOSIS — Z3A24 24 weeks gestation of pregnancy: Secondary | ICD-10-CM

## 2019-05-19 DIAGNOSIS — Z3A28 28 weeks gestation of pregnancy: Secondary | ICD-10-CM

## 2019-05-19 DIAGNOSIS — Q6602 Congenital talipes equinovarus, left foot: Secondary | ICD-10-CM

## 2019-05-19 DIAGNOSIS — O09893 Supervision of other high risk pregnancies, third trimester: Secondary | ICD-10-CM

## 2019-05-19 DIAGNOSIS — O99213 Obesity complicating pregnancy, third trimester: Secondary | ICD-10-CM

## 2019-05-19 LAB — POCT URINALYSIS DIPSTICK OB: Glucose, UA: NEGATIVE

## 2019-05-19 NOTE — Progress Notes (Signed)
Routine Prenatal Care Visit  Subjective  [facility] is a 34 y.o. G1P0 at [redacted]w[redacted]d being seen today for ongoing prenatal care.  She is currently monitored for the following issues for this high-risk pregnancy and has Palpitations; SVT (supraventricular tachycardia) ([place]); PCOS (polycystic ovarian syndrome); Supervision of normal first pregnancy; Congenital talipes equinus; and Maternal obesity, antepartum on their problem list.  ----------------------------------------------------------------------------------- Patient reports no complaints.  She generally feels well with beta blocker. We discussed driving restrictions. She has a long car ride 2 days per week and is reminded to take breaks every 2 hours to prevent DVTs.  Contractions: Not present. Vag. Bleeding: None.  Movement: Present. Leaking Fluid denies.  ----------------------------------------------------------------------------------- The following portions of the patient's history were reviewed and updated as appropriate: allergies, current medications, past family history, past medical history, past social history, past surgical history and problem list. Problem list updated.  Objective  Blood pressure 120/80, weight 219 lb (99.3 kg), last menstrual period 11/02/2018. Pregravid weight 205 lb (93 kg) Total Weight Gain 14 lb (6.35 kg) Urinalysis: Urine Protein    Urine Glucose    Fetal Status: Fetal Heart Rate (bpm): 138 Fundal Height: 29 cm Movement: Present     General:  Alert, oriented and cooperative. Patient is in no acute distress.  Skin: Skin is warm and dry. No rash noted.   Cardiovascular: Normal heart rate noted  Respiratory: Normal respiratory effort, no problems with respiration noted  Abdomen: Soft, gravid, appropriate for gestational age. Pain/Pressure: Absent     Pelvic:  Cervical exam deferred        Extremities: Normal range of motion.  Edema: None  Mental Status: Normal mood and affect. Normal behavior. Normal  judgment and thought content.   Assessment   34 y.o. G1P0 at [redacted]w[redacted]d by  08/09/2019, by Last Menstrual Period presenting for routine prenatal visit  Plan   pregnancy1 Problems (from 11/02/18 to present)    Problem Noted Resolved   Congenital talipes equinus 03/24/2019 by Malachy Mood, MD No   Maternal obesity, antepartum 03/24/2019 by Malachy Mood, MD No   Supervision of normal first pregnancy 02/24/2019 by Rod Can, CNM No   Overview Addendum 03/24/2019  7:30 PM by Malachy Mood, MD    [facility] Prenatal Labs  Dating LMP 20 week [place] Blood type: A/Positive/-- (11/20 0850)   Genetic Screen NIPS:Normal XY Antibody:Negative (11/20 0850)  Anatomic [place] Club foot Rubella: 1.35 (11/20 0850) Varicella: Immune  GTT Early:               Third trimester:  RPR: Non Reactive (11/20 0850)   Rhogam N/A HBsAg: Negative (11/20 0850)   TDaP vaccine                       Flu Shot: HIV: Non Reactive (11/20 0850)   Baby Food                                GBS:   Contraception  Pap: 11/20/202- NIL  CBB     CS/VBAC    Support Person Legrand Como              Preterm labor symptoms and general obstetric precautions including but not limited to vaginal bleeding, contractions, leaking of fluid and fetal movement were reviewed in detail with the patient. Please refer to After Visit Summary for other counseling recommendations.   Return in about 2 weeks (around  06/02/2019) for rob.  Tresea Mall, CNM 05/19/2019 9:50 AM

## 2019-05-19 NOTE — Addendum Note (Signed)
Addended by: Cornelius Moras D on: 05/19/2019 09:59 AM   Modules accepted: Orders

## 2019-05-19 NOTE — Patient Instructions (Signed)
Third Trimester of Pregnancy The third trimester is from week 28 through week 40 (months 7 through 9). The third trimester is a time when the unborn baby (fetus) is growing rapidly. At the end of the ninth month, the fetus is about 20 inches in length and weighs 6-10 pounds. Body changes during your third trimester Your body will continue to go through many changes during pregnancy. The changes vary from woman to woman. During the third trimester:  Your weight will continue to increase. You can expect to gain 25-35 pounds (11-16 kg) by the end of the pregnancy.  You may begin to get stretch marks on your hips, abdomen, and breasts.  You may urinate more often because the fetus is moving lower into your pelvis and pressing on your bladder.  You may develop or continue to have heartburn. This is caused by increased hormones that slow down muscles in the digestive tract.  You may develop or continue to have constipation because increased hormones slow digestion and cause the muscles that push waste through your intestines to relax.  You may develop hemorrhoids. These are swollen veins (varicose veins) in the rectum that can itch or be painful.  You may develop swollen, bulging veins (varicose veins) in your legs.  You may have increased body aches in the pelvis, back, or thighs. This is due to weight gain and increased hormones that are relaxing your joints.  You may have changes in your hair. These can include thickening of your hair, rapid growth, and changes in texture. Some women also have hair loss during or after pregnancy, or hair that feels dry or thin. Your hair will most likely return to normal after your baby is born.  Your breasts will continue to grow and they will continue to become tender. A yellow fluid (colostrum) may leak from your breasts. This is the first milk you are producing for your baby.  Your belly button may stick out.  You may notice more swelling in your hands,  face, or ankles.  You may have increased tingling or numbness in your hands, arms, and legs. The skin on your belly may also feel numb.  You may feel short of breath because of your expanding uterus.  You may have more problems sleeping. This can be caused by the size of your belly, increased need to urinate, and an increase in your body's metabolism.  You may notice the fetus "dropping," or moving lower in your abdomen (lightening).  You may have increased vaginal discharge.  You may notice your joints feel loose and you may have pain around your pelvic bone. What to expect at prenatal visits You will have prenatal exams every 2 weeks until week 36. Then you will have weekly prenatal exams. During a routine prenatal visit:  You will be weighed to make sure you and the baby are growing normally.  Your blood pressure will be taken.  Your abdomen will be measured to track your baby's growth.  The fetal heartbeat will be listened to.  Any test results from the previous visit will be discussed.  You may have a cervical check near your due date to see if your cervix has softened or thinned (effaced).  You will be tested for Group B streptococcus. This happens between 35 and 37 weeks. Your health care provider may ask you:  What your birth plan is.  How you are feeling.  If you are feeling the baby move.  If you have had any abnormal   symptoms, such as leaking fluid, bleeding, severe headaches, or abdominal cramping.  If you are using any tobacco products, including cigarettes, chewing tobacco, and electronic cigarettes.  If you have any questions. Other tests or screenings that may be performed during your third trimester include:  Blood tests that check for low iron levels (anemia).  Fetal testing to check the health, activity level, and growth of the fetus. Testing is done if you have certain medical conditions or if there are problems during the pregnancy.  Nonstress test  (NST). This test checks the health of your baby to make sure there are no signs of problems, such as the baby not getting enough oxygen. During this test, a belt is placed around your belly. The baby is made to move, and its heart rate is monitored during movement. What is false labor? False labor is a condition in which you feel small, irregular tightenings of the muscles in the womb (contractions) that usually go away with rest, changing position, or drinking water. These are called Braxton Hicks contractions. Contractions may last for hours, days, or even weeks before true labor sets in. If contractions come at regular intervals, become more frequent, increase in intensity, or become painful, you should see your health care provider. What are the signs of labor?  Abdominal cramps.  Regular contractions that start at 10 minutes apart and become stronger and more frequent with time.  Contractions that start on the top of the uterus and spread down to the lower abdomen and back.  Increased pelvic pressure and dull back pain.  A watery or bloody mucus discharge that comes from the vagina.  Leaking of amniotic fluid. This is also known as your "water breaking." It could be a slow trickle or a gush. Let your health care provider know if it has a color or strange odor. If you have any of these signs, call your health care provider right away, even if it is before your due date. Follow these instructions at home: Medicines  Follow your health care provider's instructions regarding medicine use. Specific medicines may be either safe or unsafe to take during pregnancy.  Take a prenatal vitamin that contains at least 600 micrograms (mcg) of folic acid.  If you develop constipation, try taking a stool softener if your health care provider approves. Eating and drinking   Eat a balanced diet that includes fresh fruits and vegetables, whole grains, good sources of protein such as meat, eggs, or tofu,  and low-fat dairy. Your health care provider will help you determine the amount of weight gain that is right for you.  Avoid raw meat and uncooked cheese. These carry germs that can cause birth defects in the baby.  If you have low calcium intake from food, talk to your health care provider about whether you should take a daily calcium supplement.  Eat four or five small meals rather than three large meals a day.  Limit foods that are high in fat and processed sugars, such as fried and sweet foods.  To prevent constipation: ? Drink enough fluid to keep your urine clear or pale yellow. ? Eat foods that are high in fiber, such as fresh fruits and vegetables, whole grains, and beans. Activity  Exercise only as directed by your health care provider. Most women can continue their usual exercise routine during pregnancy. Try to exercise for 30 minutes at least 5 days a week. Stop exercising if you experience uterine contractions.  Avoid heavy lifting.  Do   not exercise in extreme heat or humidity, or at high altitudes.  Wear low-heel, comfortable shoes.  Practice good posture.  You may continue to have sex unless your health care provider tells you otherwise. Relieving pain and discomfort  Take frequent breaks and rest with your legs elevated if you have leg cramps or low back pain.  Take warm sitz baths to soothe any pain or discomfort caused by hemorrhoids. Use hemorrhoid cream if your health care provider approves.  Wear a good support bra to prevent discomfort from breast tenderness.  If you develop varicose veins: ? Wear support pantyhose or compression stockings as told by your healthcare provider. ? Elevate your feet for 15 minutes, 3-4 times a day. Prenatal care  Write down your questions. Take them to your prenatal visits.  Keep all your prenatal visits as told by your health care provider. This is important. Safety  Wear your seat belt at all times when driving.  Make  a list of emergency phone numbers, including numbers for family, friends, the hospital, and police and fire departments. General instructions  Avoid cat litter boxes and soil used by cats. These carry germs that can cause birth defects in the baby. If you have a cat, ask someone to clean the litter box for you.  Do not travel far distances unless it is absolutely necessary and only with the approval of your health care provider.  Do not use hot tubs, steam rooms, or saunas.  Do not drink alcohol.  Do not use any products that contain nicotine or tobacco, such as cigarettes and e-cigarettes. If you need help quitting, ask your health care provider.  Do not use any medicinal herbs or unprescribed drugs. These chemicals affect the formation and growth of the baby.  Do not douche or use tampons or scented sanitary pads.  Do not cross your legs for long periods of time.  To prepare for the arrival of your baby: ? Take prenatal classes to understand, practice, and ask questions about labor and delivery. ? Make a trial run to the hospital. ? Visit the hospital and tour the maternity area. ? Arrange for maternity or paternity leave through employers. ? Arrange for family and friends to take care of pets while you are in the hospital. ? Purchase a rear-facing car seat and make sure you know how to install it in your car. ? Pack your hospital bag. ? Prepare the baby's nursery. Make sure to remove all pillows and stuffed animals from the baby's crib to prevent suffocation.  Visit your dentist if you have not gone during your pregnancy. Use a soft toothbrush to brush your teeth and be gentle when you floss. Contact a health care provider if:  You are unsure if you are in labor or if your water has broken.  You become dizzy.  You have mild pelvic cramps, pelvic pressure, or nagging pain in your abdominal area.  You have lower back pain.  You have persistent nausea, vomiting, or  diarrhea.  You have an unusual or bad smelling vaginal discharge.  You have pain when you urinate. Get help right away if:  Your water breaks before 37 weeks.  You have regular contractions less than 5 minutes apart before 37 weeks.  You have a fever.  You are leaking fluid from your vagina.  You have spotting or bleeding from your vagina.  You have severe abdominal pain or cramping.  You have rapid weight loss or weight gain.  You have   shortness of breath with chest pain.  You notice sudden or extreme swelling of your face, hands, ankles, feet, or legs.  Your baby makes fewer than 10 movements in 2 hours.  You have severe headaches that do not go away when you take medicine.  You have vision changes. Summary  The third trimester is from week 28 through week 40, months 7 through 9. The third trimester is a time when the unborn baby (fetus) is growing rapidly.  During the third trimester, your discomfort may increase as you and your baby continue to gain weight. You may have abdominal, leg, and back pain, sleeping problems, and an increased need to urinate.  During the third trimester your breasts will keep growing and they will continue to become tender. A yellow fluid (colostrum) may leak from your breasts. This is the first milk you are producing for your baby.  False labor is a condition in which you feel small, irregular tightenings of the muscles in the womb (contractions) that eventually go away. These are called Braxton Hicks contractions. Contractions may last for hours, days, or even weeks before true labor sets in.  Signs of labor can include: abdominal cramps; regular contractions that start at 10 minutes apart and become stronger and more frequent with time; watery or bloody mucus discharge that comes from the vagina; increased pelvic pressure and dull back pain; and leaking of amniotic fluid. This information is not intended to replace advice given to you by your  health care provider. Make sure you discuss any questions you have with your health care provider. Document Revised: 05/30/2018 Document Reviewed: 03/14/2016 [employer] Patient Education  2020 [employer]  

## 2019-05-20 LAB — 28 WEEK RH+PANEL
Basophils Absolute: 0 10*3/uL (ref 0.0–0.2)
Basos: 0 %
EOS (ABSOLUTE): 0.1 10*3/uL (ref 0.0–0.4)
Eos: 1 %
Gestational Diabetes Screen: 105 mg/dL (ref 65–139)
HIV Screen 4th Generation wRfx: NONREACTIVE
Hematocrit: 32.1 % — ABNORMAL LOW (ref 34.0–46.6)
Hemoglobin: 10.6 g/dL — ABNORMAL LOW (ref 11.1–15.9)
Immature Grans (Abs): 0.1 10*3/uL (ref 0.0–0.1)
Immature Granulocytes: 1 %
Lymphocytes Absolute: 1.8 10*3/uL (ref 0.7–3.1)
Lymphs: 18 %
MCH: 28 pg (ref 26.6–33.0)
MCHC: 33 g/dL (ref 31.5–35.7)
MCV: 85 fL (ref 79–97)
Monocytes Absolute: 0.7 10*3/uL (ref 0.1–0.9)
Monocytes: 7 %
Neutrophils Absolute: 7.6 10*3/uL — ABNORMAL HIGH (ref 1.4–7.0)
Neutrophils: 73 %
Platelets: 291 10*3/uL (ref 150–450)
RBC: 3.78 x10E6/uL (ref 3.77–5.28)
RDW: 12 % (ref 11.7–15.4)
RPR Ser Ql: NONREACTIVE
WBC: 10.2 10*3/uL (ref 3.4–10.8)

## 2019-05-29 ENCOUNTER — Other Ambulatory Visit: Payer: Self-pay

## 2019-05-29 ENCOUNTER — Ambulatory Visit
Admission: RE | Admit: 2019-05-29 | Discharge: 2019-05-29 | Disposition: A | Discharge status: Home / Self Care | Payer: 59 | Source: Ambulatory Visit | Attending: Obstetrics and Gynecology | Admitting: Obstetrics and Gynecology

## 2019-05-29 DIAGNOSIS — Q6602 Congenital talipes equinovarus, left foot: Secondary | ICD-10-CM

## 2019-06-02 ENCOUNTER — Other Ambulatory Visit: Payer: Self-pay

## 2019-06-02 ENCOUNTER — Encounter: Payer: Self-pay | Admitting: Obstetrics and Gynecology

## 2019-06-02 ENCOUNTER — Ambulatory Visit (INDEPENDENT_AMBULATORY_CARE_PROVIDER_SITE_OTHER): Payer: 59 | Admitting: Obstetrics and Gynecology

## 2019-06-02 VITALS — BP 122/74 | Wt 217.0 lb

## 2019-06-02 DIAGNOSIS — Z3A30 30 weeks gestation of pregnancy: Secondary | ICD-10-CM

## 2019-06-02 DIAGNOSIS — O99210 Obesity complicating pregnancy, unspecified trimester: Secondary | ICD-10-CM

## 2019-06-02 DIAGNOSIS — I471 Supraventricular tachycardia: Secondary | ICD-10-CM

## 2019-06-02 DIAGNOSIS — O99413 Diseases of the circulatory system complicating pregnancy, third trimester: Secondary | ICD-10-CM

## 2019-06-02 DIAGNOSIS — Z3403 Encounter for supervision of normal first pregnancy, third trimester: Secondary | ICD-10-CM

## 2019-06-02 DIAGNOSIS — O99013 Anemia complicating pregnancy, third trimester: Secondary | ICD-10-CM

## 2019-06-02 DIAGNOSIS — Z6839 Body mass index (BMI) 39.0-39.9, adult: Secondary | ICD-10-CM | POA: Insufficient documentation

## 2019-06-02 DIAGNOSIS — O99213 Obesity complicating pregnancy, third trimester: Secondary | ICD-10-CM

## 2019-06-02 DIAGNOSIS — Q6689 Other  specified congenital deformities of feet: Secondary | ICD-10-CM

## 2019-06-02 DIAGNOSIS — O99019 Anemia complicating pregnancy, unspecified trimester: Secondary | ICD-10-CM | POA: Insufficient documentation

## 2019-06-02 HISTORY — DX: Body mass index (BMI) 39.0-39.9, adult: Z68.39

## 2019-06-02 NOTE — Progress Notes (Signed)
Routine Prenatal Care Visit  Subjective  Kiara Reed is a 34 y.o. G1P0 at [redacted]w[redacted]d being seen today for ongoing prenatal care.  She is currently monitored for the following issues for this high-risk pregnancy and has Palpitations; SVT (supraventricular tachycardia) ([place]); PCOS (polycystic ovarian syndrome); Supervision of normal first pregnancy; Congenital talipes equinus; Maternal obesity, antepartum; BMI 39.0-39.9,adult; and Anemia of pregnancy on their problem list.  ----------------------------------------------------------------------------------- Patient reports no complaints.   Contractions: Not present. Vag. Bleeding: None.  Movement: Present. Leaking Fluid denies.  ----------------------------------------------------------------------------------- The following portions of the patient's history were reviewed and updated as appropriate: allergies, current medications, past family history, past medical history, past social history, past surgical history and problem list. Problem list updated.  Objective  Blood pressure 122/74, weight 217 lb (98.4 kg), last menstrual period 11/02/2018. Pregravid weight 205 lb (93 kg) Total Weight Gain 12 lb (5.443 kg) Urinalysis: Urine Protein    Urine Glucose    Fetal Status: Fetal Heart Rate (bpm): 140 Fundal Height: 30 cm Movement: Present     General:  Alert, oriented and cooperative. Patient is in no acute distress.  Skin: Skin is warm and dry. No rash noted.   Cardiovascular: Normal heart rate noted  Respiratory: Normal respiratory effort, no problems with respiration noted  Abdomen: Soft, gravid, appropriate for gestational age. Pain/Pressure: Absent     Pelvic:  Cervical exam deferred        Extremities: Normal range of motion.     Mental Status: Normal mood and affect. Normal behavior. Normal judgment and thought content.   Assessment   34 y.o. G1P0 at [redacted]w[redacted]d by  08/09/2019, by Last Menstrual Period presenting for routine prenatal  visit  Plan   pregnancy1 Problems (from 11/02/18 to present)    Problem Noted Resolved   BMI 39.0-39.9,adult 06/02/2019 by Will Bonnet, MD No   Anemia of pregnancy 06/02/2019 by Will Bonnet, MD No   Overview Signed 06/02/2019 12:04 PM by Will Bonnet, MD    - noted on 28 wk labs - taking iron supplementation      Congenital talipes equinus 03/24/2019 by Malachy Mood, MD No   Overview Signed 06/02/2019 11:56 AM by Will Bonnet, MD    Follow up postpartum      Maternal obesity, antepartum 03/24/2019 by Malachy Mood, MD No   Supervision of normal first pregnancy 02/24/2019 by Rod Can, CNM No   Overview Addendum 03/24/2019  7:30 PM by Malachy Mood, MD    [facility] Prenatal Labs  Dating LMP 20 week [place] Blood type: A/Positive/-- (11/20 0850)   Genetic Screen NIPS:Normal XY Antibody:Negative (11/20 0850)  Anatomic [place] Club foot Rubella: 1.35 (11/20 0850) Varicella: Immune  GTT Early:               Third trimester:  RPR: Non Reactive (11/20 0850)   Rhogam N/A HBsAg: Negative (11/20 0850)   TDaP vaccine                       Flu Shot: HIV: Non Reactive (11/20 0850)   Baby Food                                GBS:   Contraception  Pap: 11/20/202- NIL  CBB     CS/VBAC    Support Person Legrand Como          SVT (supraventricular tachycardia) ([place]) 01/23/2019 by  Wiliam Ke, RN No   Overview Signed 06/02/2019 11:56 AM by Conard Novak, MD    On metoprolol [facility] aware. OK to deliver at [facility] as long as sx do not worsen           Preterm labor symptoms and general obstetric precautions including but not limited to vaginal bleeding, contractions, leaking of fluid and fetal movement were reviewed in detail with the patient. Please refer to After Visit Summary for other counseling recommendations.   - growth at [facility] MFM recently normal. - declines TDaP - established pediatric orthopedist at [facility] - continue growth u/s per [facility]  MFM - taking iron for anemia of pregnancy  Return in about 2 weeks (around 06/16/2019) for Routine Prenatal Appointment.  Thomasene Mohair, MD, [facility], [facility] 06/02/2019 12:05 PM

## 2019-06-16 ENCOUNTER — Ambulatory Visit (INDEPENDENT_AMBULATORY_CARE_PROVIDER_SITE_OTHER): Payer: 59 | Admitting: Obstetrics and Gynecology

## 2019-06-16 ENCOUNTER — Encounter: Payer: Self-pay | Admitting: Obstetrics and Gynecology

## 2019-06-16 ENCOUNTER — Other Ambulatory Visit: Payer: Self-pay

## 2019-06-16 VITALS — BP 128/84 | Wt 223.0 lb

## 2019-06-16 DIAGNOSIS — O99213 Obesity complicating pregnancy, third trimester: Secondary | ICD-10-CM

## 2019-06-16 DIAGNOSIS — O0993 Supervision of high risk pregnancy, unspecified, third trimester: Secondary | ICD-10-CM

## 2019-06-16 DIAGNOSIS — Z3A32 32 weeks gestation of pregnancy: Secondary | ICD-10-CM

## 2019-06-16 DIAGNOSIS — O99013 Anemia complicating pregnancy, third trimester: Secondary | ICD-10-CM

## 2019-06-16 DIAGNOSIS — O99210 Obesity complicating pregnancy, unspecified trimester: Secondary | ICD-10-CM

## 2019-06-16 DIAGNOSIS — I471 Supraventricular tachycardia: Secondary | ICD-10-CM

## 2019-06-16 DIAGNOSIS — Z6839 Body mass index (BMI) 39.0-39.9, adult: Secondary | ICD-10-CM

## 2019-06-16 NOTE — Progress Notes (Signed)
Routine Prenatal Care Visit  Subjective  Kiara Reed is a 34 y.o. G1P0 at [redacted]w[redacted]d being seen today for ongoing prenatal care.  She is currently monitored for the following issues for this high-risk pregnancy and has Palpitations; SVT (supraventricular tachycardia) ([facility]); PCOS (polycystic ovarian syndrome); Supervision of high risk pregnancy, antepartum; Congenital talipes equinus; Maternal obesity, antepartum; BMI 39.0-39.9,adult; and Anemia of pregnancy on their problem list.  ----------------------------------------------------------------------------------- Patient reports an episode of SVT 2 nights ago that took over 1 hour to break. No adverse symptoms since then.   Contractions: Not present. Vag. Bleeding: None.  Movement: Present. Leaking Fluid denies.  ----------------------------------------------------------------------------------- The following portions of the patient's history were reviewed and updated as appropriate: allergies, current medications, past family history, past medical history, past social history, past surgical history and problem list. Problem list updated.  Objective  Blood pressure 128/84, weight 223 lb (101.2 kg), last menstrual period 11/02/2018. Pregravid weight 205 lb (93 kg) Total Weight Gain 18 lb (8.165 kg) Urinalysis: Urine Protein    Urine Glucose    Fetal Status: Fetal Heart Rate (bpm): 135 Fundal Height: 33 cm Movement: Present     General:  Alert, oriented and cooperative. Patient is in no acute distress.  Skin: Skin is warm and dry. No rash noted.   Cardiovascular: Normal heart rate noted  Respiratory: Normal respiratory effort, no problems with respiration noted  Abdomen: Soft, gravid, appropriate for gestational age. Pain/Pressure: Absent     Pelvic:  Cervical exam deferred        Extremities: Normal range of motion.  Edema: None  Mental Status: Normal mood and affect. Normal behavior. Normal judgment and thought content.   Assessment   34  y.o. G1P0 at [redacted]w[redacted]d by  08/09/2019, by Last Menstrual Period presenting for routine prenatal visit  Plan   pregnancy1 Problems (from 11/02/18 to present)    Problem Noted Resolved   BMI 39.0-39.9,adult 06/02/2019 by Conard Novak, MD No   Anemia of pregnancy 06/02/2019 by Conard Novak, MD No   Overview Signed 06/02/2019 12:04 PM by Conard Novak, MD    - noted on 28 wk labs - taking iron supplementation      Congenital talipes equinus 03/24/2019 by Vena Austria, MD No   Overview Signed 06/02/2019 11:56 AM by Conard Novak, MD    Follow up postpartum      Maternal obesity, antepartum 03/24/2019 by Vena Austria, MD No   Supervision of high risk pregnancy, antepartum 02/24/2019 by Tresea Mall, CNM No   Overview Addendum 03/24/2019  7:30 PM by Vena Austria, MD    [facility] Prenatal Labs  Dating LMP 20 week [place] Blood type: A/Positive/-- (11/20 0850)   Genetic Screen NIPS:Normal XY Antibody:Negative (11/20 0850)  Anatomic [place] Club foot Rubella: 1.35 (11/20 0850) Varicella: Immune  GTT Early:               Third trimester:  RPR: Non Reactive (11/20 0850)   Rhogam N/A HBsAg: Negative (11/20 0850)   TDaP vaccine                       Flu Shot: HIV: Non Reactive (11/20 0850)   Baby Food                                GBS:   Contraception  Pap: 11/20/202- NIL  CBB     CS/VBAC  Support Person Legrand Como          SVT (supraventricular tachycardia) ([place]) 01/23/2019 by Damian Leavell, RN No   Overview Signed 06/02/2019 11:56 AM by Will Bonnet, MD    On metoprolol [facility] aware. OK to deliver at [facility] as long as sx do not worsen           Preterm labor symptoms and general obstetric precautions including but not limited to vaginal bleeding, contractions, leaking of fluid and fetal movement were reviewed in detail with the patient. Please refer to After Visit Summary for other counseling recommendations.   - follow [facility]  coming up.  Return in about 2 weeks (around 06/30/2019) for Routine Prenatal Appointment.  Prentice Docker, MD, [facility], [facility] 06/16/2019 2:28 PM

## 2019-06-23 ENCOUNTER — Other Ambulatory Visit: Payer: Self-pay | Admitting: Maternal & Fetal Medicine

## 2019-06-23 DIAGNOSIS — Q6689 Other  specified congenital deformities of feet: Secondary | ICD-10-CM

## 2019-06-26 ENCOUNTER — Other Ambulatory Visit: Payer: Self-pay

## 2019-06-26 ENCOUNTER — Ambulatory Visit
Admission: RE | Admit: 2019-06-26 | Discharge: 2019-06-26 | Disposition: A | Discharge status: Home / Self Care | Payer: 59 | Source: Ambulatory Visit | Attending: Maternal & Fetal Medicine | Admitting: Maternal & Fetal Medicine

## 2019-06-26 DIAGNOSIS — O0990 Supervision of high risk pregnancy, unspecified, unspecified trimester: Secondary | ICD-10-CM

## 2019-06-26 DIAGNOSIS — Z3A33 33 weeks gestation of pregnancy: Secondary | ICD-10-CM | POA: Diagnosis not present

## 2019-06-26 DIAGNOSIS — O358XX0 Maternal care for other (suspected) fetal abnormality and damage, not applicable or unspecified: Secondary | ICD-10-CM | POA: Diagnosis not present

## 2019-06-26 DIAGNOSIS — I471 Supraventricular tachycardia: Secondary | ICD-10-CM

## 2019-06-26 DIAGNOSIS — Z6839 Body mass index (BMI) 39.0-39.9, adult: Secondary | ICD-10-CM

## 2019-06-26 DIAGNOSIS — O99210 Obesity complicating pregnancy, unspecified trimester: Secondary | ICD-10-CM

## 2019-06-26 DIAGNOSIS — Q6689 Other  specified congenital deformities of feet: Secondary | ICD-10-CM

## 2019-06-26 DIAGNOSIS — O99013 Anemia complicating pregnancy, third trimester: Secondary | ICD-10-CM

## 2019-06-30 ENCOUNTER — Encounter: Payer: 59 | Admitting: Obstetrics and Gynecology

## 2019-07-01 ENCOUNTER — Ambulatory Visit: Payer: 59 | Admitting: Cardiology

## 2019-07-03 ENCOUNTER — Other Ambulatory Visit: Payer: Self-pay

## 2019-07-03 ENCOUNTER — Encounter: Payer: Self-pay | Admitting: Obstetrics and Gynecology

## 2019-07-03 ENCOUNTER — Ambulatory Visit (INDEPENDENT_AMBULATORY_CARE_PROVIDER_SITE_OTHER): Payer: 59 | Admitting: Obstetrics and Gynecology

## 2019-07-03 VITALS — BP 110/60 | Wt 223.0 lb

## 2019-07-03 DIAGNOSIS — Q6689 Other  specified congenital deformities of feet: Secondary | ICD-10-CM

## 2019-07-03 DIAGNOSIS — O99210 Obesity complicating pregnancy, unspecified trimester: Secondary | ICD-10-CM

## 2019-07-03 DIAGNOSIS — I471 Supraventricular tachycardia: Secondary | ICD-10-CM

## 2019-07-03 DIAGNOSIS — R002 Palpitations: Secondary | ICD-10-CM

## 2019-07-03 DIAGNOSIS — Z3A34 34 weeks gestation of pregnancy: Secondary | ICD-10-CM | POA: Diagnosis not present

## 2019-07-03 DIAGNOSIS — O0993 Supervision of high risk pregnancy, unspecified, third trimester: Secondary | ICD-10-CM

## 2019-07-03 LAB — POCT URINALYSIS DIPSTICK OB
Glucose, UA: NEGATIVE
POC,PROTEIN,UA: NEGATIVE

## 2019-07-03 NOTE — Progress Notes (Signed)
Routine Prenatal Care Visit  Subjective  Kiara Reed is a 34 y.o. G1P0 at [redacted]w[redacted]d being seen today for ongoing prenatal care.  She is currently monitored for the following issues for this high-risk pregnancy and has Palpitations; SVT (supraventricular tachycardia) ([place]); PCOS (polycystic ovarian syndrome); Supervision of high risk pregnancy, antepartum; Congenital talipes equinus; Maternal obesity, antepartum; BMI 39.0-39.9,adult; and Anemia of pregnancy on their problem list.  ----------------------------------------------------------------------------------- Patient reports no complaints.   Contractions: Not present. Vag. Bleeding: None.  Movement: Present. Leaking Fluid denies.  ----------------------------------------------------------------------------------- The following portions of the patient's history were reviewed and updated as appropriate: allergies, current medications, past family history, past medical history, past social history, past surgical history and problem list. Problem list updated.  Objective  Blood pressure 110/60, weight 223 lb (101.2 kg), last menstrual period 11/02/2018. Pregravid weight 205 lb (93 kg) Total Weight Gain 18 lb (8.165 kg) Urinalysis: Urine Protein Negative  Urine Glucose Negative  Fetal Status: Fetal Heart Rate (bpm): 135 Fundal Height: 34 cm Movement: Present     General:  Alert, oriented and cooperative. Patient is in no acute distress.  Skin: Skin is warm and dry. No rash noted.   Cardiovascular: Normal heart rate noted  Respiratory: Normal respiratory effort, no problems with respiration noted  Abdomen: Soft, gravid, appropriate for gestational age. Pain/Pressure: Absent     Pelvic:  Cervical exam deferred        Extremities: Normal range of motion.  Edema: None  Mental Status: Normal mood and affect. Normal behavior. Normal judgment and thought content.   Assessment   34 y.o. G1P0 at [redacted]w[redacted]d by  08/09/2019, by Last Menstrual Period  presenting for routine prenatal visit  Plan   pregnancy1 Problems (from 11/02/18 to present)    Problem Noted Resolved   BMI 39.0-39.9,adult 06/02/2019 by Will Bonnet, MD No   Anemia of pregnancy 06/02/2019 by Will Bonnet, MD No   Overview Signed 06/02/2019 12:04 PM by Will Bonnet, MD    - noted on 28 wk labs - taking iron supplementation      Congenital talipes equinus 03/24/2019 by Malachy Mood, MD No   Overview Signed 06/02/2019 11:56 AM by Will Bonnet, MD    Follow up postpartum      Maternal obesity, antepartum 03/24/2019 by Malachy Mood, MD No   Supervision of high risk pregnancy, antepartum 02/24/2019 by Rod Can, CNM No   Overview Addendum 03/24/2019  7:30 PM by Malachy Mood, MD    [facility] Prenatal Labs  Dating LMP 20 week [place] Blood type: A/Positive/-- (11/20 0850)   Genetic Screen NIPS:Normal XY Antibody:Negative (11/20 0850)  Anatomic [place] Club foot Rubella: 1.35 (11/20 0850) Varicella: Immune  GTT Early:               Third trimester:  RPR: Non Reactive (11/20 0850)   Rhogam N/A HBsAg: Negative (11/20 0850)   TDaP vaccine                       Flu Shot: HIV: Non Reactive (11/20 0850)   Baby Food                                GBS:   Contraception  Pap: 11/20/202- NIL  CBB     CS/VBAC    Support Person Legrand Como          SVT (supraventricular tachycardia) ([place]) 01/23/2019 by  Wiliam Ke, RN No   Overview Signed 06/02/2019 11:56 AM by Conard Novak, MD    On metoprolol [facility] aware. OK to deliver at [facility] as long as sx do not worsen           Preterm labor symptoms and general obstetric precautions including but not limited to vaginal bleeding, contractions, leaking of fluid and fetal movement were reviewed in detail with the patient. Please refer to After Visit Summary for other counseling recommendations.   Return in about 2 weeks (around 07/17/2019) for Routine Prenatal Appointment.  Thomasene Mohair,  MD, [facility], [facility] 07/03/2019 11:50 AM

## 2019-07-14 ENCOUNTER — Other Ambulatory Visit: Payer: Self-pay

## 2019-07-14 ENCOUNTER — Other Ambulatory Visit: Payer: Self-pay | Admitting: Maternal & Fetal Medicine

## 2019-07-14 ENCOUNTER — Ambulatory Visit (INDEPENDENT_AMBULATORY_CARE_PROVIDER_SITE_OTHER): Payer: 59 | Admitting: Obstetrics and Gynecology

## 2019-07-14 VITALS — BP 108/66 | Wt 226.0 lb

## 2019-07-14 DIAGNOSIS — O0990 Supervision of high risk pregnancy, unspecified, unspecified trimester: Secondary | ICD-10-CM

## 2019-07-14 DIAGNOSIS — I471 Supraventricular tachycardia: Secondary | ICD-10-CM

## 2019-07-14 DIAGNOSIS — O99210 Obesity complicating pregnancy, unspecified trimester: Secondary | ICD-10-CM

## 2019-07-14 DIAGNOSIS — Z3685 Encounter for antenatal screening for Streptococcus B: Secondary | ICD-10-CM

## 2019-07-14 DIAGNOSIS — Z3A36 36 weeks gestation of pregnancy: Secondary | ICD-10-CM

## 2019-07-14 DIAGNOSIS — Q6601 Congenital talipes equinovarus, right foot: Secondary | ICD-10-CM

## 2019-07-14 NOTE — Progress Notes (Signed)
Routine Prenatal Care Visit  Subjective  Kiara Reed is a 34 y.o. G1P0 at [redacted]w[redacted]d being seen today for ongoing prenatal care.  She is currently monitored for the following issues for this high-risk pregnancy and has Palpitations; SVT (supraventricular tachycardia) ([place]); PCOS (polycystic ovarian syndrome); Supervision of high risk pregnancy, antepartum; Congenital talipes equinus; Maternal obesity, antepartum; BMI 39.0-39.9,adult; and Anemia of pregnancy on their problem list.  ----------------------------------------------------------------------------------- Patient reports no complaints.   Contractions: Not present. Vag. Bleeding: None.  Movement: Present. Denies leaking of fluid.  ----------------------------------------------------------------------------------- The following portions of the patient's history were reviewed and updated as appropriate: allergies, current medications, past family history, past medical history, past social history, past surgical history and problem list. Problem list updated.   Objective  Blood pressure 108/66, weight 226 lb (102.5 kg), last menstrual period 11/02/2018. Pregravid weight 205 lb (93 kg) Total Weight Gain 21 lb (9.526 kg)  Body mass index is 41.34 kg/m.  Urinalysis:      Fetal Status: Fetal Heart Rate (bpm): 135   Movement: Present  Presentation: Vertex  General:  Alert, oriented and cooperative. Patient is in no acute distress.  Skin: Skin is warm and dry. No rash noted.   Cardiovascular: Normal heart rate noted  Respiratory: Normal respiratory effort, no problems with respiration noted  Abdomen: Soft, gravid, appropriate for gestational age. Pain/Pressure: Absent     Pelvic:  Cervical exam deferred Dilation: Fingertip Effacement (%): 30 Station: -3  Extremities: Normal range of motion.     ental Status: Normal mood and affect. Normal behavior. Normal judgment and thought content.     Assessment   34 y.o. G1P0 at [redacted]w[redacted]d by   08/09/2019, by Last Menstrual Period presenting for routine prenatal visit  Plan   pregnancy1 Problems (from 11/02/18 to present)    Problem Noted Resolved   BMI 39.0-39.9,adult 06/02/2019 by Will Bonnet, MD No   Anemia of pregnancy 06/02/2019 by Will Bonnet, MD No   Overview Signed 06/02/2019 12:04 PM by Will Bonnet, MD    - noted on 28 wk labs - taking iron supplementation      Congenital talipes equinus 03/24/2019 by Malachy Mood, MD No   Overview Signed 06/02/2019 11:56 AM by Will Bonnet, MD    Follow up postpartum      Maternal obesity, antepartum 03/24/2019 by Malachy Mood, MD No   Supervision of high risk pregnancy, antepartum 02/24/2019 by Rod Can, CNM No   Overview Addendum 03/24/2019  7:30 PM by Malachy Mood, MD    [facility] Prenatal Labs  Dating LMP 20 week [place] Blood type: A/Positive/-- (11/20 0850)   Genetic Screen NIPS:Normal XY Antibody:Negative (11/20 0850)  Anatomic [place] Club foot Rubella: 1.35 (11/20 0850) Varicella: Immune  GTT Early:               Third trimester:  RPR: Non Reactive (11/20 0850)   Rhogam N/A HBsAg: Negative (11/20 0850)   TDaP vaccine                       Flu Shot: HIV: Non Reactive (11/20 0850)   Baby Food                                GBS:   Contraception  Pap: 11/20/202- NIL  CBB     CS/VBAC    Support Person Legrand Como  SVT (supraventricular tachycardia) ([facility]) 01/23/2019 by Wiliam Ke, RN No   Overview Signed 06/02/2019 11:56 AM by Conard Novak, MD    On metoprolol [facility] aware. OK to deliver at [facility] as long as sx do not worsen           Gestational age appropriate obstetric precautions including but not limited to vaginal bleeding, contractions, leaking of fluid and fetal movement were reviewed in detail with the patient.    - GBS today - Growth scan next week secondary to metoprolol use during pregnancy and BMI>40 - Discussed arrive study and 39 week IOL.  The  ARRIVE study was a national multicenter trial that randomized 6,106 patients to induction of labor at 39 weeks 0 days to 39 weeks 4 days (3,062) compared to expectant management (3,044).  There was no significant difference in adverse perinatal outcomes but there was a significantly lower rate of cesarean delivery, as well as lower rate of maternal hypertensive complications in the induction group.  Number to treat was calculated as 28 induction of labor to prevent 1 primary Cesarean section.  "Labor Induction versus Expectant Management in Nulliparous Low-Risk Women" The [place] Journal of Medicine iAugust 9, 2018 Vol. 379 No. 6   Return in about 1 week (around 07/21/2019) for ROB, Growth scan, and NST.  Vena Austria, MD, [facility], [facility] 07/14/2019, 9:53 AM

## 2019-07-14 NOTE — Progress Notes (Signed)
ROB GBS today 

## 2019-07-16 LAB — STREP GP B NAA: Strep Gp B NAA: NEGATIVE

## 2019-07-17 ENCOUNTER — Other Ambulatory Visit: Payer: Self-pay

## 2019-07-17 ENCOUNTER — Ambulatory Visit
Admission: RE | Admit: 2019-07-17 | Discharge: 2019-07-17 | Disposition: A | Discharge status: Home / Self Care | Payer: 59 | Source: Ambulatory Visit | Attending: Maternal & Fetal Medicine | Admitting: Maternal & Fetal Medicine

## 2019-07-17 DIAGNOSIS — Q6601 Congenital talipes equinovarus, right foot: Secondary | ICD-10-CM

## 2019-07-17 DIAGNOSIS — I471 Supraventricular tachycardia: Secondary | ICD-10-CM | POA: Insufficient documentation

## 2019-07-17 DIAGNOSIS — Z3A36 36 weeks gestation of pregnancy: Secondary | ICD-10-CM | POA: Diagnosis not present

## 2019-07-17 DIAGNOSIS — Z6839 Body mass index (BMI) 39.0-39.9, adult: Secondary | ICD-10-CM

## 2019-07-17 DIAGNOSIS — O99413 Diseases of the circulatory system complicating pregnancy, third trimester: Secondary | ICD-10-CM | POA: Insufficient documentation

## 2019-07-17 DIAGNOSIS — O99013 Anemia complicating pregnancy, third trimester: Secondary | ICD-10-CM

## 2019-07-17 DIAGNOSIS — O99210 Obesity complicating pregnancy, unspecified trimester: Secondary | ICD-10-CM

## 2019-07-17 DIAGNOSIS — O0990 Supervision of high risk pregnancy, unspecified, unspecified trimester: Secondary | ICD-10-CM

## 2019-07-17 DIAGNOSIS — Q6689 Other  specified congenital deformities of feet: Secondary | ICD-10-CM

## 2019-07-22 ENCOUNTER — Ambulatory Visit (INDEPENDENT_AMBULATORY_CARE_PROVIDER_SITE_OTHER): Payer: 59 | Admitting: Advanced Practice Midwife

## 2019-07-22 ENCOUNTER — Encounter: Payer: Self-pay | Admitting: Advanced Practice Midwife

## 2019-07-22 ENCOUNTER — Other Ambulatory Visit: Payer: Self-pay

## 2019-07-22 ENCOUNTER — Ambulatory Visit (INDEPENDENT_AMBULATORY_CARE_PROVIDER_SITE_OTHER): Payer: 59

## 2019-07-22 VITALS — BP 122/74 | Wt 229.0 lb

## 2019-07-22 DIAGNOSIS — Z3A36 36 weeks gestation of pregnancy: Secondary | ICD-10-CM

## 2019-07-22 DIAGNOSIS — O99210 Obesity complicating pregnancy, unspecified trimester: Secondary | ICD-10-CM

## 2019-07-22 DIAGNOSIS — O99413 Diseases of the circulatory system complicating pregnancy, third trimester: Secondary | ICD-10-CM | POA: Diagnosis not present

## 2019-07-22 DIAGNOSIS — I471 Supraventricular tachycardia: Secondary | ICD-10-CM

## 2019-07-22 DIAGNOSIS — Z3A37 37 weeks gestation of pregnancy: Secondary | ICD-10-CM

## 2019-07-22 DIAGNOSIS — O99213 Obesity complicating pregnancy, third trimester: Secondary | ICD-10-CM | POA: Diagnosis not present

## 2019-07-22 DIAGNOSIS — O0993 Supervision of high risk pregnancy, unspecified, third trimester: Secondary | ICD-10-CM

## 2019-07-22 DIAGNOSIS — O0990 Supervision of high risk pregnancy, unspecified, unspecified trimester: Secondary | ICD-10-CM | POA: Diagnosis not present

## 2019-07-22 NOTE — Patient Instructions (Signed)
Pain Relief During Labor and Delivery Many things can cause pain during labor and delivery, including:  Pressure on bones and ligaments due to the baby moving through the pelvis.  Stretching of tissues due to the baby moving through the birth canal.  Muscle tension due to anxiety or nervousness.  The uterus tightening (contracting) and relaxing to help move the baby. There are many ways to deal with the pain of labor and delivery. They include:  Taking prenatal classes. Taking these classes helps you know what to expect during your baby's birth. What you learn will increase your confidence and decrease your anxiety.  Practicing relaxation techniques or doing relaxing activities, such as: ? Focused breathing. ? Meditation. ? Visualization. ? Aroma therapy. ? Listening to your favorite music. ? Hypnosis.  Taking a warm shower or bath (hydrotherapy). This may: ? Provide comfort and relaxation. ? Lessen your perception of pain. ? Decrease the amount of pain medicine needed. ? Decrease the length of labor.  Getting a massage or counterpressure on your back.  Applying warm packs or ice packs.  Changing positions often, moving around, or using a birthing ball.  Getting: ? Pain medicine through an IV or injection into a muscle. ? Pain medicine inserted into your spinal column. ? Injections of sterile water just under the skin on your lower back (intradermal injections). ? Laughing gas (nitrous oxide). Discuss your pain control options with your health care provider during your prenatal visits. Explore the options offered by your hospital or birth center. What kinds of medicine are available? There are two kinds of medicines that can be used to relieve pain during labor and delivery:  Analgesics. These medicines decrease pain without causing you to lose feeling or the ability to move your muscles.  Anesthetics. These medicines block feeling in the body and can decrease your  ability to move freely. Both of these kinds of medicine can cause minor side effects, such as nausea, trouble concentrating, and sleepiness. They can also decrease the baby's heart rate before birth and affect the baby's breathing rate after birth. For this reason, health care providers are careful about when and how much medicine is given. What are specific medicines and procedures that provide pain relief? Local Anesthetics Local anesthetics are used to numb a small area of the body. They may be used along with another kind of anesthetic or used to numb the nerves of the vagina, cervix, and perineum during the second stage of labor. General Anesthetics General anesthetics cause you to lose consciousness so you do not feel pain. They are usually only used for an emergency cesarean delivery. General anesthetics are given through an IV tube and a mask. Pudendal Block A pudendal block is a form of local anesthetic. It may be used to relieve the pain associated with pushing or stretching of the perineum at the time of delivery or to further numb the perineum. A pudendal block is done by injecting numbing medicine through the vaginal wall into a nerve in the pelvis. Epidural Analgesia Epidural analgesia is given through a flexible IV catheter that is inserted into the lower back. Numbing medicine is delivered continuously to the area near your spinal column nerves (epidural space). After having this type of analgesia, you may be able to move your legs but you most likely will not be able to walk. Depending on the amount of medicine given, you may lose all feeling in the lower half of your body, or you may retain some level   of sensation, including the urge to push. Epidural analgesia can be used to provide pain relief for a vaginal birth. Spinal Block A spinal block is similar to epidural analgesia, but the medicine is injected into the spinal fluid instead of the epidural space. A spinal block is only given  once. It starts to relieve pain quickly, but the pain relief lasts only 1-6 hours. Spinal blocks can be used for cesarean deliveries. Combined Spinal-Epidural (CSE) Block A CSE block combines the effects of a spinal block and epidural analgesia. The spinal block works quickly to block all pain. The epidural analgesia provides continuous pain relief, even after the effects of the spinal block have worn off. This information is not intended to replace advice given to you by your health care provider. Make sure you discuss any questions you have with your health care provider. Document Revised: 01/19/2017 Document Reviewed: 06/30/2015 Elsevier Patient Education  2020 [employer] of the uterus can occur throughout pregnancy, but they are not always a sign that you are in labor. You may have practice contractions called Braxton Hicks contractions. These false labor contractions are sometimes confused with true labor. What are Deberah Pelton contractions? Braxton Hicks contractions are tightening movements that occur in the muscles of the uterus before labor. Unlike true labor contractions, these contractions do not result in opening (dilation) and thinning of the cervix. Toward the end of pregnancy (32-34 weeks), Braxton Hicks contractions can happen more often and may become stronger. These contractions are sometimes difficult to tell apart from true labor because they can be very uncomfortable. You should not feel embarrassed if you go to the hospital with false labor. Sometimes, the only way to tell if you are in true labor is for your health care provider to look for changes in the cervix. The health care provider will do a physical exam and may monitor your contractions. If you are not in true labor, the exam should show that your cervix is not dilating and your water has not broken. If there are no other health problems associated with your pregnancy, it is  completely safe for you to be sent home with false labor. You may continue to have Braxton Hicks contractions until you go into true labor. How to tell the difference between true labor and false labor True labor  Contractions last 30-70 seconds.  Contractions become very regular.  Discomfort is usually felt in the top of the uterus, and it spreads to the lower abdomen and low back.  Contractions do not go away with walking.  Contractions usually become more intense and increase in frequency.  The cervix dilates and gets thinner. False labor  Contractions are usually shorter and not as strong as true labor contractions.  Contractions are usually irregular.  Contractions are often felt in the front of the lower abdomen and in the groin.  Contractions may go away when you walk around or change positions while lying down.  Contractions get weaker and are shorter-lasting as time goes on.  The cervix usually does not dilate or become thin. Follow these instructions at home:   Take over-the-counter and prescription medicines only as told by your health care provider.  Keep up with your usual exercises and follow other instructions from your health care provider.  Eat and drink lightly if you think you are going into labor.  If Braxton Hicks contractions are making you uncomfortable: ? Change your position from lying down or resting to walking,  or change from walking to resting. ? Sit and rest in a tub of warm water. ? Drink enough fluid to keep your urine pale yellow. Dehydration may cause these contractions. ? Do slow and deep breathing several times an hour.  Keep all follow-up prenatal visits as told by your health care provider. This is important. Contact a health care provider if:  You have a fever.  You have continuous pain in your abdomen. Get help right away if:  Your contractions become stronger, more regular, and closer together.  You have fluid leaking or  gushing from your vagina.  You pass blood-tinged mucus (bloody show).  You have bleeding from your vagina.  You have low back pain that you never had before.  You feel your baby's head pushing down and causing pelvic pressure.  Your baby is not moving inside you as much as it used to. Summary  Contractions that occur before labor are called Braxton Hicks contractions, false labor, or practice contractions.  Braxton Hicks contractions are usually shorter, weaker, farther apart, and less regular than true labor contractions. True labor contractions usually become progressively stronger and regular, and they become more frequent.  Manage discomfort from Braxton Hicks contractions by changing position, resting in a warm bath, drinking plenty of water, or practicing deep breathing. This information is not intended to replace advice given to you by your health care provider. Make sure you discuss any questions you have with your health care provider. Document Revised: 01/19/2017 Document Reviewed: 06/22/2016 Elsevier Patient Education  2020 Elsevier Inc.  

## 2019-07-22 NOTE — Progress Notes (Signed)
NST today. And growth scan

## 2019-07-22 NOTE — Progress Notes (Signed)
Routine Prenatal Care Visit  Subjective  Kiara Reed is a 34 y.o. G1P0 at [redacted]w[redacted]d being seen today for ongoing prenatal care.  She is currently monitored for the following issues for this high-risk pregnancy and has Palpitations; SVT (supraventricular tachycardia) (HCC); PCOS (polycystic ovarian syndrome); Supervision of high risk pregnancy, antepartum; Congenital talipes equinus; Maternal obesity, antepartum; BMI 39.0-39.9,adult; and Anemia of pregnancy on their problem list.  ----------------------------------------------------------------------------------- Patient reports no complaints. We discussed labor induction process. She would like 39 week induction.  Contractions: Not present. Vag. Bleeding: None.  Movement: Present. Leaking Fluid denies.  ----------------------------------------------------------------------------------- The following portions of the patient's history were reviewed and updated as appropriate: allergies, current medications, past family history, past medical history, past social history, past surgical history and problem list. Problem list updated.  Objective  Blood pressure 122/74, weight 229 lb (103.9 kg), last menstrual period 11/02/2018. Pregravid weight 205 lb (93 kg) Total Weight Gain 24 lb (10.9 kg) Urinalysis: Urine Protein    Urine Glucose    Fetal Status: Fetal Heart Rate (bpm): 120   Movement: Present  Presentation: Vertex   Growth: 66.1%, AC 83.6%, 7 pounds 6 ounces, AFI: 9.8 cm NST: Reactive 20 minute tracing, 120 bpm baseline, moderate variability, +accelerations, -decelerations  General:  Alert, oriented and cooperative. Patient is in no acute distress.  Skin: Skin is warm and dry. No rash noted.   Cardiovascular: Normal heart rate noted  Respiratory: Normal respiratory effort, no problems with respiration noted  Abdomen: Soft, gravid, appropriate for gestational age. Pain/Pressure: Absent     Pelvic:  Cervical exam deferred        Extremities:  Normal range of motion.  Edema: None  Mental Status: Normal mood and affect. Normal behavior. Normal judgment and thought content.   Assessment   34 y.o. G1P0 at [redacted]w[redacted]d by  08/09/2019, by Last Menstrual Period presenting for routine prenatal visit  Plan   pregnancy1 Problems (from 11/02/18 to present)    Problem Noted Resolved   BMI 39.0-39.9,adult 06/02/2019 by Conard Novak, MD No   Anemia of pregnancy 06/02/2019 by Conard Novak, MD No   Overview Signed 06/02/2019 12:04 PM by Conard Novak, MD    - noted on 28 wk labs - taking iron supplementation      Congenital talipes equinus 03/24/2019 by Vena Austria, MD No   Overview Signed 06/02/2019 11:56 AM by Conard Novak, MD    Follow up postpartum      Maternal obesity, antepartum 03/24/2019 by Vena Austria, MD No   Supervision of high risk pregnancy, antepartum 02/24/2019 by Tresea Mall, CNM No   Overview Addendum 03/24/2019  7:30 PM by Vena Austria, MD    Clinic [facility] Prenatal Labs  Dating LMP 20 week [place] Blood type: A/Positive/-- (11/20 0850)   Genetic Screen NIPS:Normal XY Antibody:Negative (11/20 0850)  Anatomic [place] Club foot Rubella: 1.35 (11/20 0850) Varicella: Immune  GTT Early:               Third trimester:  RPR: Non Reactive (11/20 0850)   Rhogam N/A HBsAg: Negative (11/20 0850)   TDaP vaccine                       Flu Shot: HIV: Non Reactive (11/20 0850)   Baby Food                                GBS:  Contraception  Pap: 11/20/202- NIL  CBB     CS/VBAC    Support Person Legrand Como          SVT (supraventricular tachycardia) ([place]) 01/23/2019 by Damian Leavell, RN No   Overview Signed 06/02/2019 11:56 AM by Will Bonnet, MD    On metoprolol [facility] aware. OK to deliver at [facility] as long as sx do not worsen           Term labor symptoms and general obstetric precautions including but not limited to vaginal bleeding, contractions, leaking of fluid and fetal movement were  reviewed in detail with the patient. Please refer to After Visit Summary for other counseling recommendations.   Return in about 1 week (around 07/29/2019) for afi/nst/rob.  Rod Can, CNM 07/22/2019 9:01 AM

## 2019-07-30 ENCOUNTER — Ambulatory Visit (INDEPENDENT_AMBULATORY_CARE_PROVIDER_SITE_OTHER): Payer: 59

## 2019-07-30 ENCOUNTER — Other Ambulatory Visit: Payer: Self-pay | Admitting: Obstetrics

## 2019-07-30 ENCOUNTER — Other Ambulatory Visit: Payer: Self-pay

## 2019-07-30 ENCOUNTER — Ambulatory Visit (INDEPENDENT_AMBULATORY_CARE_PROVIDER_SITE_OTHER): Payer: 59 | Admitting: Obstetrics

## 2019-07-30 VITALS — BP 110/70 | Wt 229.0 lb

## 2019-07-30 DIAGNOSIS — O0993 Supervision of high risk pregnancy, unspecified, third trimester: Secondary | ICD-10-CM

## 2019-07-30 DIAGNOSIS — I471 Supraventricular tachycardia: Secondary | ICD-10-CM

## 2019-07-30 DIAGNOSIS — O0990 Supervision of high risk pregnancy, unspecified, unspecified trimester: Secondary | ICD-10-CM

## 2019-07-30 DIAGNOSIS — Z3A38 38 weeks gestation of pregnancy: Secondary | ICD-10-CM

## 2019-07-30 DIAGNOSIS — O99210 Obesity complicating pregnancy, unspecified trimester: Secondary | ICD-10-CM | POA: Diagnosis not present

## 2019-07-30 LAB — FETAL NONSTRESS TEST

## 2019-07-30 NOTE — Progress Notes (Addendum)
IUP 38 w 4 days  Today. ROB visit with AFI, NST.   Risks: club foot - seen by MFM [facility]. High BMI GBS negative. Denies any danger sxs. Active baby boy- "Doneta Public"  Reactive NST today with nml AFI.  EFW 7.5 lbs today. Discussed option of IOL at 39 weeks. She gets keloids readily and is afraid of a CS and scarring. VE: no change from previous exam- Cervix quite posterior, softening P: RTC early next week. She will consider IOL and lte [place] know then. Benefits/risk of IOL.   Patient contacted the office after leaving. She talked with her husband, and now desires the IOL-  We have set her up for elective IOL on Monday 6/14 at 0500. Plan to start with cervical ripening with Cytotec.  Quentin Angst Notified.  Pt contacted by phone and knows to get COVID testing on Friday. Orders placed by CNM.

## 2019-07-30 NOTE — Addendum Note (Signed)
Addended by: Mirna Mires on: 07/30/2019 01:23 PM   Modules accepted: Kipp Brood

## 2019-07-30 NOTE — H&P (Signed)
Kiara Reed is a 34 y.o. female presenting for elective induction at 39 weeks 2 days. She is opting for IOL because she hopes that by being induced she will avoid a larger baby and thus there might be less chance of a CS. The patient has a hx of bad keloid scars and she is afraid of developing keloid scarring along her lower abdomen. She is aware that as a G1 , her pelvis is "untested", and that she will most likely need some cervical ripening .  She has had some recent SOB when going upstairs which she associates with her advancing pregnancy. OB History    Gravida  1   Para      Term      Preterm      AB      Living        SAB      TAB      Ectopic      Multiple      Live Births             Past Medical History:  Diagnosis Date  . SOB (shortness of breath)   . SVT (supraventricular tachycardia) (HCC)    Past Surgical History:  Procedure Laterality Date  . TONSILLECTOMY AND ADENOIDECTOMY     Family History: family history includes Bipolar disorder in her mother; Bladder Cancer in her mother; COPD in her maternal grandmother; Cervical cancer in her mother; Leukemia in her paternal aunt. Social History:  reports that she has never smoked. She has never used smokeless tobacco. She reports that she does not drink alcohol or use drugs.     Maternal Diabetes: No Genetic Screening: Normal Maternal Ultrasounds/Referrals: Other: club foot seen/ MFM referral/  Fetal Ultrasounds or other Referrals:  Referred to Materal Fetal Medicine  Maternal Substance Abuse:  No Significant Maternal Medications:  Meds include: Other: Metoprolol Significant Maternal Lab Results:  Group B Strep negative Other Comments:  None  Review of Systems  Constitutional: Negative.   HENT: Negative.   Eyes: Negative.   Respiratory: Positive for shortness of breath.   Cardiovascular: Negative.   Gastrointestinal: Negative.   Endocrine: Negative.   Genitourinary: Negative.   Musculoskeletal:  Negative.   Skin: Negative.   Allergic/Immunologic: Negative.   Neurological: Negative.   Hematological: Negative.   Psychiatric/Behavioral: Negative.    History Dilation: Fingertip Effacement (%): 30 Station: -3 Blood pressure 110/70, weight 229 lb (103.9 kg), last menstrual period 11/02/2018. Exam Physical Exam  Constitutional: She is oriented to person, place, and time. She appears well-developed and well-nourished.  HENT:  Head: Normocephalic and atraumatic.  Eyes: Pupils are equal, round, and reactive to light.  Cardiovascular: Normal rate and regular rhythm.  Respiratory: Effort normal and breath sounds normal.  GI: Soft. Bowel sounds are normal.  Genitourinary:    Vagina and uterus normal.   Musculoskeletal:        General: Normal range of motion.     Cervical back: Normal range of motion and neck supple.  Neurological: She is alert and oriented to person, place, and time. She has normal reflexes.  Skin: Skin is warm and dry.  Psychiatric: She has a normal mood and affect.    Prenatal labs: ABO, Rh: A/Positive/-- (11/20 0850) Antibody: Negative (11/20 0850) Rubella: 1.35 (11/20 0850) RPR: Non Reactive (03/29 0953)  HBsAg: Negative (11/20 0850)  HIV: Non Reactive (03/29 0953)  GBS: Negative/-- (05/24 0953)   Assessment/Plan: IUP 39 weeks 2 days for elective IOL GBS  negative For cervical ripening Hx SVT- Metolprolol Fetus has club foot.  IOL set up for Monday, 07/25/2019 at 0500. She will test for COVID Friday,  08/02/2019   Mirna Mires 07/30/2019, 1:02 PM

## 2019-08-01 ENCOUNTER — Other Ambulatory Visit: Payer: Self-pay

## 2019-08-01 ENCOUNTER — Other Ambulatory Visit
Admission: RE | Admit: 2019-08-01 | Discharge: 2019-08-01 | Disposition: A | Discharge status: Home / Self Care | Payer: 59 | Source: Ambulatory Visit | Attending: Obstetrics | Admitting: Obstetrics

## 2019-08-01 DIAGNOSIS — Z01812 Encounter for preprocedural laboratory examination: Secondary | ICD-10-CM | POA: Insufficient documentation

## 2019-08-01 DIAGNOSIS — Z20822 Contact with and (suspected) exposure to covid-19: Secondary | ICD-10-CM | POA: Insufficient documentation

## 2019-08-01 LAB — SARS CORONAVIRUS 2 (TAT 6-24 HRS): SARS Coronavirus 2: POSITIVE — AB

## 2019-08-02 ENCOUNTER — Telehealth: Payer: Self-pay | Admitting: Physician Assistant

## 2019-08-02 NOTE — Telephone Encounter (Signed)
Called to discuss with patient about Covid symptoms and the use of bamlanivimab/etesevimab or casirivimab/imdevimab, a monoclonal antibody infusion for those with mild to moderate Covid symptoms and at a high risk of hospitalization.  Pt is qualified for this infusion at the [facility] infusion center due to pregnancy. Pt is currently scheduled for induction on Monday 6/14 and had screening covid test 6/12.   Message left to call back  Cline Crock PA-C  [facility]

## 2019-08-03 ENCOUNTER — Telehealth: Payer: Self-pay

## 2019-08-03 NOTE — Telephone Encounter (Signed)
RN noted positive Covid result from Induction of Labor testing. Harris MD notified of result and IOL to be postponed until further notice. Pt instructed that [facility] will be calling her tomorrow morning to discuss rescheduling her IOL at a later time. Patient verbalized understanding and has no further questions at this time.

## 2019-08-04 ENCOUNTER — Telehealth: Payer: Self-pay | Admitting: Obstetrics and Gynecology

## 2019-08-04 ENCOUNTER — Ambulatory Visit (INDEPENDENT_AMBULATORY_CARE_PROVIDER_SITE_OTHER): Payer: 59 | Admitting: Obstetrics and Gynecology

## 2019-08-04 ENCOUNTER — Encounter: Payer: Self-pay | Admitting: Obstetrics and Gynecology

## 2019-08-04 ENCOUNTER — Other Ambulatory Visit: Payer: Self-pay

## 2019-08-04 DIAGNOSIS — O98513 Other viral diseases complicating pregnancy, third trimester: Secondary | ICD-10-CM | POA: Insufficient documentation

## 2019-08-04 DIAGNOSIS — U071 COVID-19: Secondary | ICD-10-CM

## 2019-08-04 DIAGNOSIS — O99210 Obesity complicating pregnancy, unspecified trimester: Secondary | ICD-10-CM

## 2019-08-04 DIAGNOSIS — O0990 Supervision of high risk pregnancy, unspecified, unspecified trimester: Secondary | ICD-10-CM

## 2019-08-04 DIAGNOSIS — Z3A39 39 weeks gestation of pregnancy: Secondary | ICD-10-CM

## 2019-08-04 HISTORY — DX: Other viral diseases complicating pregnancy, third trimester: O98.513

## 2019-08-04 NOTE — Telephone Encounter (Signed)
Called and left voicemail about appointment changes for today's appointment being a over the phone visit.

## 2019-08-04 NOTE — Telephone Encounter (Signed)
-----   Message from Nadara Mustard, MD sent at 08/03/2019  8:59 PM EDT ----- Regarding: sch tele appt Monday (pt has Covid test POS) to discuss IOL options Any OB  IOL cancelled for 6/14 am due to Covid Pos test on Friday Protocol to delay elective procedures inacted Will need to determine time to re-schedule IOL

## 2019-08-04 NOTE — Progress Notes (Signed)
Routine Prenatal Care Visit- Virtual Visit  Subjective   Virtual Visit via Telephone Note  I connected with Kiara Reed on 08/04/19 at  4:30 PM EDT by telephone and verified that I am speaking with the correct person using two identifiers.   I discussed the limitations, risks, security and privacy concerns of performing an evaluation and management service by telephone and the availability of in person appointments. I also discussed with the patient that there may be a patient responsible charge related to this service. The patient expressed understanding and agreed to proceed.  The patient was at home I spoke with the patient from my office The names of people involved in this encounter were: Kiara Reed and her husband and Kiara Mohair, MD.   Kiara Reed is a 34 y.o. G1P0 at [redacted]w[redacted]d being seen today for ongoing prenatal care.  She is currently monitored for the following issues for this high-risk pregnancy and has Palpitations; SVT (supraventricular tachycardia) (HCC); PCOS (polycystic ovarian syndrome); Supervision of high risk pregnancy, antepartum; Congenital talipes equinus; Maternal obesity, antepartum; BMI 39.0-39.9,adult; Anemia of pregnancy; and COVID-19 affecting pregnancy in third trimester on their problem list.  ----------------------------------------------------------------------------------- Patient reports no symptoms of COVID19. Contractions: Not present. Vag. Bleeding: None.  Movement: Present. Denies leaking of fluid.  ----------------------------------------------------------------------------------- The following portions of the patient's history were reviewed and updated as appropriate: allergies, current medications, past family history, past medical history, past social history, past surgical history and problem list. Problem list updated.  Objective  Last menstrual period 11/02/2018. Pregravid weight 205 lb (93 kg) Total Weight Gain 24 lb (10.9 kg) Urinalysis:       Fetal Status:     Movement: Present     Physical Exam could not be performed. Because of the COVID-19 outbreak this visit was performed over the phone and not in person.   Assessment   34 y.o. G1P0 at [redacted]w[redacted]d by  08/09/2019, by Last Menstrual Period presenting for routine prenatal visit  Plan   pregnancy1 Problems (from 11/02/18 to present)    Problem Noted Resolved   COVID-19 affecting pregnancy in third trimester 08/04/2019 by Conard Novak, MD No   BMI 39.0-39.9,adult 06/02/2019 by Conard Novak, MD No   Anemia of pregnancy 06/02/2019 by Conard Novak, MD No   Overview Signed 06/02/2019 12:04 PM by Conard Novak, MD    - noted on 28 wk labs - taking iron supplementation      Congenital talipes equinus 03/24/2019 by Vena Austria, MD No   Overview Signed 06/02/2019 11:56 AM by Conard Novak, MD    Follow up postpartum      Maternal obesity, antepartum 03/24/2019 by Vena Austria, MD No   Supervision of high risk pregnancy, antepartum 02/24/2019 by Tresea Mall, CNM No   Overview Addendum 07/30/2019  9:23 AM by Mirna Mires, CNM    Clinic [facility] Prenatal Labs  Dating LMP 20 week [place] Blood type: A/Positive/-- (11/20 0850)   Genetic Screen NIPS:Normal XY Antibody:Negative (11/20 0850)  Anatomic [place] Club foot Rubella: 1.35 (11/20 0850) Varicella: Immune  GTT Early:               Third trimester:  RPR: Non Reactive (11/20 0850)   Rhogam N/A HBsAg: Negative (11/20 0850)   TDaP vaccine                       Flu Shot: negative  [employer]  Contraception  Pap: 11/20/202- NIL  CBB     CS/VBAC    Support Person Legrand Como          Previous Version   SVT (supraventricular tachycardia) ([place]) 01/23/2019 by Damian Leavell, RN No   Overview Signed 06/02/2019 11:56 AM by Will Bonnet, MD    On metoprolol [facility] aware. OK to deliver at [facility] as long as sx do not worsen         Gestational age appropriate  obstetric precautions including but not limited to vaginal bleeding, contractions, leaking of fluid and fetal movement were reviewed in detail with the patient.    Follow Up Instructions: IOL to be rescheduled to 6/22 at noon (first day restrictions lifted) - reiterated precautions to go to L&D (bleeding, decreased fetal movement, labor, rupture of membranes)   I discussed the assessment and treatment plan with the patient. The patient was provided an opportunity to ask questions and all were answered. The patient agreed with the plan and demonstrated an understanding of the instructions.   The patient was advised to call back or seek an in-person evaluation if the symptoms worsen or if the condition fails to improve as anticipated.  I provided 15 minutes of non-face-to-face time during this encounter.  Return in about 1 week (around 08/11/2019) for IOL.  Prentice Docker, MD  [facility], [facility] 08/04/2019 4:48 PM

## 2019-08-12 ENCOUNTER — Inpatient Hospital Stay: Payer: 59 | Admitting: Anesthesiology

## 2019-08-12 ENCOUNTER — Encounter: Payer: Self-pay | Admitting: Anesthesiology

## 2019-08-12 ENCOUNTER — Other Ambulatory Visit: Payer: Self-pay

## 2019-08-12 ENCOUNTER — Inpatient Hospital Stay
Admission: RE | Admit: 2019-08-12 | Discharge: 2019-08-14 | DRG: 768 | Disposition: A | Discharge status: Home / Self Care | Payer: 59 | Attending: Obstetrics and Gynecology | Admitting: Obstetrics and Gynecology

## 2019-08-12 ENCOUNTER — Encounter: Payer: Self-pay | Admitting: Advanced Practice Midwife

## 2019-08-12 DIAGNOSIS — G5722 Lesion of femoral nerve, left lower limb: Secondary | ICD-10-CM | POA: Diagnosis not present

## 2019-08-12 DIAGNOSIS — R200 Anesthesia of skin: Secondary | ICD-10-CM | POA: Diagnosis not present

## 2019-08-12 DIAGNOSIS — O99893 Other specified diseases and conditions complicating puerperium: Secondary | ICD-10-CM | POA: Diagnosis not present

## 2019-08-12 DIAGNOSIS — D649 Anemia, unspecified: Secondary | ICD-10-CM | POA: Diagnosis present

## 2019-08-12 DIAGNOSIS — U071 COVID-19: Secondary | ICD-10-CM | POA: Diagnosis present

## 2019-08-12 DIAGNOSIS — O99210 Obesity complicating pregnancy, unspecified trimester: Secondary | ICD-10-CM

## 2019-08-12 DIAGNOSIS — O99213 Obesity complicating pregnancy, third trimester: Secondary | ICD-10-CM | POA: Diagnosis not present

## 2019-08-12 DIAGNOSIS — O9942 Diseases of the circulatory system complicating childbirth: Secondary | ICD-10-CM | POA: Diagnosis present

## 2019-08-12 DIAGNOSIS — Z3A40 40 weeks gestation of pregnancy: Secondary | ICD-10-CM | POA: Diagnosis not present

## 2019-08-12 DIAGNOSIS — O9852 Other viral diseases complicating childbirth: Secondary | ICD-10-CM | POA: Diagnosis present

## 2019-08-12 DIAGNOSIS — I471 Supraventricular tachycardia: Secondary | ICD-10-CM | POA: Diagnosis present

## 2019-08-12 DIAGNOSIS — O9902 Anemia complicating childbirth: Principal | ICD-10-CM | POA: Diagnosis present

## 2019-08-12 DIAGNOSIS — O26893 Other specified pregnancy related conditions, third trimester: Secondary | ICD-10-CM | POA: Diagnosis present

## 2019-08-12 DIAGNOSIS — R531 Weakness: Secondary | ICD-10-CM | POA: Diagnosis not present

## 2019-08-12 DIAGNOSIS — O99013 Anemia complicating pregnancy, third trimester: Secondary | ICD-10-CM

## 2019-08-12 DIAGNOSIS — O98513 Other viral diseases complicating pregnancy, third trimester: Secondary | ICD-10-CM

## 2019-08-12 DIAGNOSIS — O0990 Supervision of high risk pregnancy, unspecified, unspecified trimester: Secondary | ICD-10-CM

## 2019-08-12 DIAGNOSIS — O99214 Obesity complicating childbirth: Secondary | ICD-10-CM | POA: Diagnosis present

## 2019-08-12 DIAGNOSIS — Z3490 Encounter for supervision of normal pregnancy, unspecified, unspecified trimester: Secondary | ICD-10-CM

## 2019-08-12 DIAGNOSIS — Q6689 Other  specified congenital deformities of feet: Secondary | ICD-10-CM

## 2019-08-12 DIAGNOSIS — Z6839 Body mass index (BMI) 39.0-39.9, adult: Secondary | ICD-10-CM

## 2019-08-12 LAB — CBC
HCT: 29.6 % — ABNORMAL LOW (ref 36.0–46.0)
Hemoglobin: 9.8 g/dL — ABNORMAL LOW (ref 12.0–15.0)
MCH: 25.7 pg — ABNORMAL LOW (ref 26.0–34.0)
MCHC: 33.1 g/dL (ref 30.0–36.0)
MCV: 77.7 fL — ABNORMAL LOW (ref 80.0–100.0)
Platelets: 195 10*3/uL (ref 150–400)
RBC: 3.81 MIL/uL — ABNORMAL LOW (ref 3.87–5.11)
RDW: 13.9 % (ref 11.5–15.5)
WBC: 8.8 10*3/uL (ref 4.0–10.5)
nRBC: 0 % (ref 0.0–0.2)

## 2019-08-12 LAB — TYPE AND SCREEN
ABO/RH(D): A POS
Antibody Screen: NEGATIVE

## 2019-08-12 LAB — ABO/RH: ABO/RH(D): A POS

## 2019-08-12 MED ORDER — OXYTOCIN-SODIUM CHLORIDE 30-0.9 UT/500ML-% IV SOLN
2.5000 [IU]/h | INTRAVENOUS | Status: DC
  Administered 2019-08-13: 2.5 [IU]/h via INTRAVENOUS

## 2019-08-12 MED ORDER — DIPHENHYDRAMINE HCL 50 MG/ML IJ SOLN: 12.5000 mg | INTRAMUSCULAR | Status: DC | PRN

## 2019-08-12 MED ORDER — ACETAMINOPHEN 325 MG PO TABS: 650.0000 mg | ORAL_TABLET | ORAL | Status: DC | PRN

## 2019-08-12 MED ORDER — TERBUTALINE SULFATE 1 MG/ML IJ SOLN: 0.2500 mg | Freq: Once | INTRAMUSCULAR | Status: DC | PRN

## 2019-08-12 MED ORDER — ONDANSETRON HCL 4 MG/2ML IJ SOLN: 4.0000 mg | Freq: Four times a day (QID) | INTRAMUSCULAR | Status: DC | PRN

## 2019-08-12 MED ORDER — MISOPROSTOL 200 MCG PO TABS
ORAL_TABLET | ORAL | Status: AC
  Filled 2019-08-12: qty 4

## 2019-08-12 MED ORDER — OXYTOCIN 10 UNIT/ML IJ SOLN
INTRAMUSCULAR | Status: AC
  Filled 2019-08-12: qty 2

## 2019-08-12 MED ORDER — PHENYLEPHRINE 40 MCG/ML (10ML) SYRINGE FOR IV PUSH (FOR BLOOD PRESSURE SUPPORT): 80.0000 ug | PREFILLED_SYRINGE | INTRAVENOUS | Status: DC | PRN

## 2019-08-12 MED ORDER — LIDOCAINE-EPINEPHRINE (PF) 1.5 %-1:200000 IJ SOLN
INTRAMUSCULAR | Status: DC | PRN
  Administered 2019-08-12: 3 mL via EPIDURAL

## 2019-08-12 MED ORDER — MISOPROSTOL 25 MCG QUARTER TABLET
25.0000 ug | ORAL_TABLET | ORAL | Status: DC | PRN
  Administered 2019-08-12: 25 ug via VAGINAL
  Filled 2019-08-12: qty 1

## 2019-08-12 MED ORDER — EPHEDRINE 5 MG/ML INJ: 10.0000 mg | INTRAVENOUS | Status: DC | PRN

## 2019-08-12 MED ORDER — SODIUM CHLORIDE 0.9 % IV SOLN
INTRAVENOUS | Status: DC | PRN
  Administered 2019-08-12 (×2): 5 mL via EPIDURAL

## 2019-08-12 MED ORDER — LIDOCAINE HCL (PF) 1 % IJ SOLN
INTRAMUSCULAR | Status: AC
  Administered 2019-08-13: 30 mL
  Filled 2019-08-12: qty 30

## 2019-08-12 MED ORDER — LACTATED RINGERS IV SOLN: INTRAVENOUS | Status: DC

## 2019-08-12 MED ORDER — OXYCODONE-ACETAMINOPHEN 5-325 MG PO TABS: 1.0000 | ORAL_TABLET | ORAL | Status: DC | PRN

## 2019-08-12 MED ORDER — OXYCODONE-ACETAMINOPHEN 5-325 MG PO TABS: 2.0000 | ORAL_TABLET | ORAL | Status: DC | PRN

## 2019-08-12 MED ORDER — LACTATED RINGERS IV SOLN
500.0000 mL | Freq: Once | INTRAVENOUS | Status: AC
  Administered 2019-08-12: 500 mL via INTRAVENOUS

## 2019-08-12 MED ORDER — FENTANYL 2.5 MCG/ML W/ROPIVACAINE 0.15% IN NS 100 ML EPIDURAL (ARMC)
12.0000 mL/h | EPIDURAL | Status: DC
  Administered 2019-08-12 – 2019-08-13 (×2): 12 mL/h via EPIDURAL
  Filled 2019-08-12: qty 100

## 2019-08-12 MED ORDER — AMMONIA AROMATIC IN INHA
RESPIRATORY_TRACT | Status: AC
  Filled 2019-08-12: qty 10

## 2019-08-12 MED ORDER — OXYTOCIN-SODIUM CHLORIDE 30-0.9 UT/500ML-% IV SOLN
1.0000 m[IU]/min | INTRAVENOUS | Status: DC
  Filled 2019-08-12: qty 500

## 2019-08-12 MED ORDER — OXYTOCIN BOLUS FROM INFUSION
500.0000 mL | Freq: Once | INTRAVENOUS | Status: AC
  Administered 2019-08-13: 500 mL via INTRAVENOUS

## 2019-08-12 MED ORDER — LIDOCAINE HCL (PF) 1 % IJ SOLN
INTRAMUSCULAR | Status: DC | PRN
  Administered 2019-08-12: 4 mL via SUBCUTANEOUS

## 2019-08-12 MED ORDER — SOD CITRATE-CITRIC ACID 500-334 MG/5ML PO SOLN: 30.0000 mL | ORAL | Status: DC | PRN

## 2019-08-12 MED ORDER — LIDOCAINE HCL (PF) 1 % IJ SOLN
30.0000 mL | INTRAMUSCULAR | Status: AC | PRN
  Administered 2019-08-13: 30 mL via SUBCUTANEOUS
  Filled 2019-08-12: qty 30

## 2019-08-12 MED ORDER — FENTANYL 2.5 MCG/ML W/ROPIVACAINE 0.15% IN NS 100 ML EPIDURAL (ARMC)
EPIDURAL | Status: AC
  Filled 2019-08-12: qty 100

## 2019-08-12 MED ORDER — LACTATED RINGERS IV SOLN: 500.0000 mL | INTRAVENOUS | Status: DC | PRN

## 2019-08-12 NOTE — Anesthesia Preprocedure Evaluation (Signed)
Anesthesia Evaluation  Patient identified by MRN, date of birth, ID band Patient awake    Reviewed: Allergy & Precautions, H&P , NPO status , Patient's Chart, lab work & pertinent test results, reviewed documented beta blocker date and time   History of Anesthesia Complications Negative for: history of anesthetic complications  Airway Mallampati: II  TM Distance: >3 FB Neck ROM: full    Dental no notable dental hx.    Pulmonary neg pulmonary ROS,    Pulmonary exam normal breath sounds clear to auscultation       Cardiovascular Exercise Tolerance: Good (-) hypertension(-) angina+ dysrhythmias Supra Ventricular Tachycardia + Valvular Problems/Murmurs  Rhythm:regular Rate:Normal     Neuro/Psych negative neurological ROS  negative psych ROS   GI/Hepatic Neg liver ROS, GERD  ,  Endo/Other  neg diabetesMorbid obesity  Renal/GU negative Renal ROS  negative genitourinary   Musculoskeletal   Abdominal   Peds  Hematology  (+) Blood dyscrasia, anemia ,   Anesthesia Other Findings Past Medical History: No date: SOB (shortness of breath) No date: SVT (supraventricular tachycardia) (HCC)   Reproductive/Obstetrics negative OB ROS                             Anesthesia Physical Anesthesia Plan  ASA: III  Anesthesia Plan: Epidural   Post-op Pain Management:    Induction:   PONV Risk Score and Plan:   Airway Management Planned:   Additional Equipment:   Intra-op Plan:   Post-operative Plan:   Informed Consent: I have reviewed the patients History and Physical, chart, labs and discussed the procedure including the risks, benefits and alternatives for the proposed anesthesia with the patient or authorized representative who has indicated his/her understanding and acceptance.     Dental Advisory Given  Plan Discussed with: Anesthesiologist, CRNA and Surgeon  Anesthesia Plan Comments:          Anesthesia Quick Evaluation

## 2019-08-12 NOTE — Anesthesia Procedure Notes (Signed)
Epidural Patient location during procedure: OB Start time: 08/12/2019 7:41 PM End time: 08/12/2019 7:45 PM  Staffing Anesthesiologist: Lenard Simmer, MD Performed: anesthesiologist   Preanesthetic Checklist Completed: patient identified, IV checked, site marked, risks and benefits discussed, surgical consent, monitors and equipment checked, pre-op evaluation and timeout performed  Epidural Patient position: sitting Prep: ChloraPrep Patient monitoring: heart rate, continuous pulse ox and blood pressure Approach: midline Location: L3-L4 Injection technique: LOR saline  Needle:  Needle type: Tuohy  Needle gauge: 17 G Needle length: 9 cm and 9 Needle insertion depth: 5.5 cm Catheter type: closed end flexible Catheter size: 19 Gauge Catheter at skin depth: 10.5 cm Test dose: negative and 1.5% lidocaine with Epi 1:200 K  Assessment Sensory level: T10 Events: blood not aspirated (1st), injection not painful, no injection resistance, no paresthesia and negative IV test  Additional Notes 1st attempt Pt. Evaluated and documentation done after procedure finished. Patient identified. Risks/Benefits/Options discussed with patient including but not limited to bleeding, infection, nerve damage, paralysis, failed block, incomplete pain control, headache, blood pressure changes, nausea, vomiting, reactions to medication both or allergic, itching and postpartum back pain. Confirmed with bedside nurse the patient's most recent platelet count. Confirmed with patient that they are not currently taking any anticoagulation, have any bleeding history or any family history of bleeding disorders. Patient expressed understanding and wished to proceed. All questions were answered. Sterile technique was used throughout the entire procedure. Please see nursing notes for vital signs. Test dose was given through epidural catheter and negative prior to continuing to dose epidural or start infusion. Warning signs  of high block given to the patient including shortness of breath, tingling/numbness in hands, complete motor block, or any concerning symptoms with instructions to call for help. Patient was given instructions on fall risk and not to get out of bed. All questions and concerns addressed with instructions to call with any issues or inadequate analgesia.   Patient tolerated the insertion well without immediate complications.Reason for block:procedure for pain

## 2019-08-12 NOTE — Progress Notes (Signed)
RN spoke to Highland Holiday from infection prevention regarding patient's previous COVID + test and the patient being asymptomatic. She states that the patient does not need to be on any precautions and states she will relay the information with IP.

## 2019-08-12 NOTE — Anesthesia Preprocedure Evaluation (Deleted)
Anesthesia Evaluation  Patient identified by MRN, date of birth, ID band Patient awake    Reviewed: Allergy & Precautions, H&P , NPO status , Patient's Chart, lab work & pertinent test results, reviewed documented beta blocker date and time   History of Anesthesia Complications Negative for: history of anesthetic complications  Airway Mallampati: II  TM Distance: >3 FB Neck ROM: full    Dental no notable dental hx.    Pulmonary neg pulmonary ROS,    Pulmonary exam normal breath sounds clear to auscultation       Cardiovascular Exercise Tolerance: Good (-) hypertension(-) angina(-) Past MI + dysrhythmias Supra Ventricular Tachycardia + Valvular Problems/Murmurs  Rhythm:regular Rate:Normal     Neuro/Psych negative neurological ROS  negative psych ROS   GI/Hepatic Neg liver ROS, GERD  ,  Endo/Other  neg diabetesMorbid obesity  Renal/GU negative Renal ROS  negative genitourinary   Musculoskeletal   Abdominal   Peds  Hematology  (+) Blood dyscrasia, anemia ,   Anesthesia Other Findings Past Medical History: No date: SOB (shortness of breath) No date: SVT (supraventricular tachycardia) (HCC)   Reproductive/Obstetrics negative OB ROS                             Anesthesia Physical Anesthesia Plan  ASA: III  Anesthesia Plan: Epidural   Post-op Pain Management:    Induction:   PONV Risk Score and Plan:   Airway Management Planned:   Additional Equipment:   Intra-op Plan:   Post-operative Plan:   Informed Consent: I have reviewed the patients History and Physical, chart, labs and discussed the procedure including the risks, benefits and alternatives for the proposed anesthesia with the patient or authorized representative who has indicated his/her understanding and acceptance.     Dental Advisory Given  Plan Discussed with: Anesthesiologist, CRNA and Surgeon  Anesthesia Plan  Comments:         Anesthesia Quick Evaluation

## 2019-08-12 NOTE — Progress Notes (Signed)
SVE 6/80/-2 IUPC placed without issue Continue with IOL. Patient making change on her own. Will start pitocin if needed.   Adelene Idler MD, [facility], [facility] 08/12/2019 9:29 PM

## 2019-08-12 NOTE — H&P (Signed)
Kiara Reed is an 34 y.o. female.   Chief Complaint: Desires induction of labor HPI: Patient is feeling well. She has no complaints. She denies LOF. She denies vaginal bleeding. She has normal fetal movement  pregnancy1 Problems (from 11/02/18 to present)    Problem Noted Resolved   COVID-19 affecting pregnancy in third trimester 08/04/2019 by Will Bonnet, MD No   BMI 39.0-39.9,adult 06/02/2019 by Will Bonnet, MD No   Anemia of pregnancy 06/02/2019 by Will Bonnet, MD No   Overview Signed 06/02/2019 12:04 PM by Will Bonnet, MD    - noted on 28 wk labs - taking iron supplementation      Congenital talipes equinus 03/24/2019 by Malachy Mood, MD No   Overview Signed 06/02/2019 11:56 AM by Will Bonnet, MD    Follow up postpartum      Maternal obesity, antepartum 03/24/2019 by Malachy Mood, MD No   Supervision of high risk pregnancy, antepartum 02/24/2019 by Rod Can, CNM No   Overview Addendum 07/30/2019  9:23 AM by Imagene Riches, [place] Prenatal Labs  Dating LMP 20 week [place] Blood type: A/Positive/-- (11/20 0850)   Genetic Screen NIPS:Normal XY Antibody:Negative (11/20 0850)  Anatomic [place] Club foot Rubella: 1.35 (11/20 0850) Varicella: Immune  GTT Early:               Third trimester: 105 RPR: Non Reactive (11/20 0850)   Rhogam N/A HBsAg: Negative (11/20 0850)   TDaP vaccine     Declined   Flu Shot: negative  Baby Food                                  Contraception  Pap: 11/20/202- NIL  CBB     CS/VBAC    Support Person Legrand Como          Previous Version   SVT (supraventricular tachycardia) ([facility]) 01/23/2019 by Damian Leavell, RN No   Overview Signed 06/02/2019 11:56 AM by Will Bonnet, MD    On metoprolol [facility] aware. OK to deliver at [facility] as long as sx do not worsen           Past Medical History:  Diagnosis Date  . SOB (shortness of breath)   . SVT (supraventricular tachycardia) (HCC)      Past Surgical History:  Procedure Laterality Date  . TONSILLECTOMY AND ADENOIDECTOMY      Family History  Problem Relation Age of Onset  . Cervical cancer Mother   . Bladder Cancer Mother   . Bipolar disorder Mother   . COPD Maternal Grandmother   . Leukemia Paternal Aunt    Social History:  reports that she has never smoked. She has never used smokeless tobacco. She reports that she does not drink alcohol and does not use drugs.  Allergies:  Allergies  Allergen Reactions  . Latex Rash    Medications Prior to Admission  Medication Sig Dispense Refill  . Ferrous Sulfate (IRON SLOW RELEASE) 140 (45 Fe) MG TBCR Take 45 mg by mouth every morning.    . metoprolol tartrate (LOPRESSOR) 25 MG tablet Starting after 02/14/2019-take one tablet by mouth daily.  May take an additional one tablet daily. 145 tablet 3  . Multiple Vitamin (MULTI-VITAMIN DAILY PO) Take 1 tablet by mouth daily.      Results for orders placed or performed during the hospital encounter of 08/12/19 (  from the past 48 hour(s))  CBC     Status: Abnormal   Collection Time: 08/12/19  1:10 PM  Result Value Ref Range   WBC 8.8 4.0 - 10.5 K/uL   RBC 3.81 (L) 3.87 - 5.11 MIL/uL   Hemoglobin 9.8 (L) 12.0 - 15.0 g/dL   HCT 16.1 (L) 36 - 46 %   MCV 77.7 (L) 80.0 - 100.0 fL   MCH 25.7 (L) 26.0 - 34.0 pg   MCHC 33.1 30.0 - 36.0 g/dL   RDW 09.6 04.5 - 40.9 %   Platelets 195 150 - 400 K/uL   nRBC 0.0 0.0 - 0.2 %    Comment: Performed at [facility], [address] [place], [place] [address]  Type and screen     Status: None   Collection Time: 08/12/19  1:10 PM  Result Value Ref Range   ABO/RH(D) A POS    Antibody Screen NEG    Sample Expiration      08/15/2019,2359 Performed at [facility], [address] [place], [place] [address]   ABO/Rh     Status: None   Collection Time: 08/12/19  2:07 PM  Result Value Ref Range   ABO/RH(D)      A POS Performed at [facility], [address] [place], [place] [address]    No results found.  Review of Systems  Constitutional: Negative for chills and fever.  HENT: Negative for congestion, hearing loss and sinus pain.   Respiratory: Negative for cough, shortness of breath and wheezing.   Cardiovascular: Negative for chest pain, palpitations and leg swelling.  Gastrointestinal: Negative for abdominal pain, constipation, diarrhea, nausea and vomiting.  Genitourinary: Negative for dysuria, flank pain, frequency, hematuria and urgency.  Musculoskeletal: Negative for back pain.  Skin: Negative for rash.  Neurological: Negative for dizziness and headaches.  Psychiatric/Behavioral: Negative for suicidal ideas. The patient is not nervous/anxious.     Blood pressure 133/75, pulse (!) 52, temperature 97.8 F (36.6 C), temperature source Oral, resp. rate 18, height 5\' 2"  (1.575 m), weight 103.4 kg, last menstrual period 11/02/2018, SpO2 99 %. Physical Exam  Nursing note and vitals reviewed. Constitutional: She is oriented to person, place, and time. She appears well-developed.  HENT:  Head: Normocephalic and atraumatic.  Cardiovascular: Normal rate and regular rhythm.  Respiratory: Effort normal and breath sounds normal.  GI: Soft. Bowel sounds are normal.  Musculoskeletal:        General: Normal range of motion.  Neurological: She is alert and oriented to person, place, and time.  Skin: Skin is warm and dry.  Psychiatric: Her behavior is normal. Judgment and thought content normal.     Assessment/Plan 34 yo G1P0 [redacted]w[redacted]d Will proceed with elective IOL Start with cytotec Clear liquid diet Epidural when desired  [redacted]w[redacted]d, MD 08/12/2019, 9:33 PM

## 2019-08-12 NOTE — Progress Notes (Signed)
Perienum and pad wet with clear fluid, patient appears to or ruptured psontaneously Painfuly contracting. SVE: 3/80/-3  NST: 120 bpm baseline, moderate variability, 15x15 accelerations, no decelerations. Tocometer : every 2-3 minutes  Patient planning epidural. Continue with induction of labor, will start pitocin after epidural I needed.  Adelene Idler MD, [facility], [facility] 08/12/2019 7:14 PM

## 2019-08-13 ENCOUNTER — Inpatient Hospital Stay: Payer: 59

## 2019-08-13 ENCOUNTER — Encounter: Payer: Self-pay | Admitting: Obstetrics and Gynecology

## 2019-08-13 DIAGNOSIS — Z3A40 40 weeks gestation of pregnancy: Secondary | ICD-10-CM

## 2019-08-13 DIAGNOSIS — O99213 Obesity complicating pregnancy, third trimester: Secondary | ICD-10-CM

## 2019-08-13 DIAGNOSIS — G5722 Lesion of femoral nerve, left lower limb: Secondary | ICD-10-CM

## 2019-08-13 LAB — CBC
HCT: 31.1 % — ABNORMAL LOW (ref 36.0–46.0)
Hemoglobin: 10.4 g/dL — ABNORMAL LOW (ref 12.0–15.0)
MCH: 25.4 pg — ABNORMAL LOW (ref 26.0–34.0)
MCHC: 33.4 g/dL (ref 30.0–36.0)
MCV: 76 fL — ABNORMAL LOW (ref 80.0–100.0)
Platelets: 176 10*3/uL (ref 150–400)
RBC: 4.09 MIL/uL (ref 3.87–5.11)
RDW: 13.9 % (ref 11.5–15.5)
WBC: 20.3 10*3/uL — ABNORMAL HIGH (ref 4.0–10.5)
nRBC: 0 % (ref 0.0–0.2)

## 2019-08-13 LAB — RPR: RPR Ser Ql: NONREACTIVE

## 2019-08-13 MED ORDER — OXYCODONE HCL 5 MG PO TABS: 10.0000 mg | ORAL_TABLET | ORAL | Status: DC | PRN

## 2019-08-13 MED ORDER — ONDANSETRON HCL 4 MG PO TABS: 4.0000 mg | ORAL_TABLET | ORAL | Status: DC | PRN

## 2019-08-13 MED ORDER — DIPHENHYDRAMINE HCL 25 MG PO CAPS: 25.0000 mg | ORAL_CAPSULE | Freq: Four times a day (QID) | ORAL | Status: DC | PRN

## 2019-08-13 MED ORDER — OXYTOCIN-SODIUM CHLORIDE 30-0.9 UT/500ML-% IV SOLN
INTRAVENOUS | Status: AC
  Filled 2019-08-13: qty 500

## 2019-08-13 MED ORDER — DOCUSATE SODIUM 100 MG PO CAPS
100.0000 mg | ORAL_CAPSULE | Freq: Two times a day (BID) | ORAL | Status: DC
  Administered 2019-08-13 – 2019-08-14 (×2): 100 mg via ORAL
  Filled 2019-08-13 (×2): qty 1

## 2019-08-13 MED ORDER — ACETAMINOPHEN 500 MG PO TABS
1000.0000 mg | ORAL_TABLET | Freq: Four times a day (QID) | ORAL | Status: DC
  Administered 2019-08-13 – 2019-08-14 (×4): 1000 mg via ORAL
  Filled 2019-08-13 (×4): qty 2

## 2019-08-13 MED ORDER — IBUPROFEN 600 MG PO TABS
600.0000 mg | ORAL_TABLET | Freq: Four times a day (QID) | ORAL | Status: DC
  Administered 2019-08-13 – 2019-08-14 (×6): 600 mg via ORAL
  Filled 2019-08-13 (×6): qty 1

## 2019-08-13 MED ORDER — ONDANSETRON HCL 4 MG/2ML IJ SOLN: 4.0000 mg | INTRAMUSCULAR | Status: DC | PRN

## 2019-08-13 MED ORDER — ZOLPIDEM TARTRATE 5 MG PO TABS: 5.0000 mg | ORAL_TABLET | Freq: Every evening | ORAL | Status: DC | PRN

## 2019-08-13 MED ORDER — METOPROLOL TARTRATE 25 MG PO TABS
25.0000 mg | ORAL_TABLET | Freq: Two times a day (BID) | ORAL | Status: DC
  Administered 2019-08-13 – 2019-08-14 (×4): 25 mg via ORAL
  Filled 2019-08-13 (×6): qty 1

## 2019-08-13 MED ORDER — WITCH HAZEL-GLYCERIN EX PADS
1.0000 "application " | MEDICATED_PAD | CUTANEOUS | Status: DC | PRN
  Administered 2019-08-14: 1 via TOPICAL
  Filled 2019-08-13: qty 100

## 2019-08-13 MED ORDER — OXYCODONE HCL 5 MG PO TABS: 5.0000 mg | ORAL_TABLET | ORAL | Status: DC | PRN

## 2019-08-13 MED ORDER — METHYLERGONOVINE MALEATE 0.2 MG/ML IJ SOLN
INTRAMUSCULAR | Status: AC
  Administered 2019-08-13: 0.2 mg via INTRAMUSCULAR
  Filled 2019-08-13: qty 1

## 2019-08-13 MED ORDER — PRENATAL MULTIVITAMIN CH
1.0000 | ORAL_TABLET | Freq: Every day | ORAL | Status: DC
  Administered 2019-08-13 – 2019-08-14 (×2): 1 via ORAL
  Filled 2019-08-13 (×2): qty 1

## 2019-08-13 MED ORDER — METOPROLOL TARTRATE 50 MG PO TABS
25.0000 mg | ORAL_TABLET | Freq: Two times a day (BID) | ORAL | Status: DC
  Filled 2019-08-13: qty 0.5

## 2019-08-13 MED ORDER — SIMETHICONE 80 MG PO CHEW: 80.0000 mg | CHEWABLE_TABLET | ORAL | Status: DC | PRN

## 2019-08-13 MED ORDER — BENZOCAINE-MENTHOL 20-0.5 % EX AERO
1.0000 "application " | INHALATION_SPRAY | CUTANEOUS | Status: DC | PRN
  Administered 2019-08-13 – 2019-08-14 (×2): 1 via TOPICAL
  Filled 2019-08-13 (×2): qty 56

## 2019-08-13 MED ORDER — COCONUT OIL OIL
1.0000 "application " | TOPICAL_OIL | Status: DC | PRN
  Filled 2019-08-13: qty 120

## 2019-08-13 MED ORDER — DIBUCAINE (PERIANAL) 1 % EX OINT
1.0000 "application " | TOPICAL_OINTMENT | CUTANEOUS | Status: DC | PRN
  Administered 2019-08-14: 1 via RECTAL
  Filled 2019-08-13 (×2): qty 28

## 2019-08-13 NOTE — Progress Notes (Signed)
Vacuum used by Dr. Jerene Pitch at delivery of baby. Flat vacuum applied at 0331. Pressure applied at 0332 for 40 seconds. Pressure applied for second time at 0336 for 45 seconds. Vacuum removed at 0337 followed by delivery of baby at [phone]. Apgars 7,9.

## 2019-08-13 NOTE — Lactation Note (Signed)
This note was copied from a baby's chart. Lactation Consultation Note  Patient Name: Kiara Reed GGYIR'S Date: 08/13/2019 Reason for consult: Initial assessment;1st time breastfeeding;Term  LC in to see mom and baby Kiara Reed. This is mom's first baby and first time breastfeeding. Mom has been using nipple shield, unknown of where it came from- and states baby has latched well to both sides at both of the previous feedings. Mom is continuing to work on being comfortable in positions and using support pillows- would like suggestions and assistance as next feeding. LC reviewed normal newborn behavior and feeding pattern, early hunger cues/feeding on cue, output expectations, and benefits of skin to skin. Parents had no questions at this time, and plan to call out at next feeding. Whiteboard updated with LC name/number.  Maternal Data Formula Feeding for Exclusion: No Has patient been taught Hand Expression?: Yes (per mom) Does the patient have breastfeeding experience prior to this delivery?: No  Feeding Feeding Type: Breast Fed  LATCH Score                   Interventions Interventions: Breast feeding basics reviewed  Lactation Tools Discussed/Used     Consult Status Consult Status: Follow-up Date: 08/13/19 Follow-up type: In-patient    Danford Bad 08/13/2019, 12:12 PM

## 2019-08-13 NOTE — Consult Note (Signed)
Patient was evaluated at 1400. Patient explained the sensation on her left inner thigh as tingly and weak. Both legs were noted to be equally strong on assessment. Does not appear to be related to epidural. Dr. Henrene Hawking had neurology consulted.

## 2019-08-13 NOTE — Discharge Summary (Signed)
Postpartum Discharge Summary       Patient Name: Kiara Reed DOB: 01-Dec-1985 MRN: [id number]  Date of admission: 08/12/2019 Delivery date:08/13/2019  Delivering provider: Adrian Prows R  Date of discharge: 08/14/2019  Admitting diagnosis: Elective  Induction of labor Intrauterine pregnancy: [redacted]w[redacted]d    Secondary diagnosis:  none    Additional problems: SVT, Maternal obesity    Discharge diagnosis: Vacuum assisted vaginal delivery, recurrent variable decelerations, Third degree perineal laceration-3a                                             Post partum procedures: none Augmentation: Cytotec Complications: None  Hospital course: Induction of Labor With Vaginal Delivery   34y.o. yo G1P0 at 32w4das admitted to the hospital 08/12/2019 for induction of labor.  Indication for induction: Elective.  Patient had an uncomplicated labor course as follows: Membrane Rupture Time/Date: 6:30 PM ,08/12/2019   Delivery Method:Vaginal, Vacuum (Extractor)  Episiotomy: None  Lacerations:  3rd degree  Details of delivery can be found in separate delivery note.  Patient had a routine postpartum course. Patient is discharged home 08/14/19.  Newborn Data: Birth date:08/13/2019  Birth time:3:38 AM  Gender:Female  Living status:Living  Apgars:7 ,9  Weight:3510 g   Magnesium Sulfate received: No BMZ received: No Rhophylac:No MMR:No T-DaP: Declined Flu: No Transfusion:No  Physical exam  Vitals:   08/13/19 1627 08/13/19 1956 08/13/19 2334 08/14/19 0744  BP: 115/62 107/60 110/66 116/63  Pulse: 60 61 (!) 55 (!) 59  Resp: '18 20 20 16  ' Temp: 98.1 F (36.7 C) 98.2 F (36.8 C) 98 F (36.7 C) 98.4 F (36.9 C)  TempSrc: Oral Oral Oral Oral  SpO2:  97% 97% 98%  Weight:      Height:       General: alert, cooperative and no distress Lochia: appropriate Uterine Fundus: firm/ ML/ at U Perineum: Healing well with no significant drainage DVT Evaluation: No evidence of DVT seen on  physical exam. Labs: Lab Results  Component Value Date   WBC 20.3 (H) 08/13/2019   HGB 10.4 (L) 08/13/2019   HCT 31.1 (L) 08/13/2019   MCV 76.0 (L) 08/13/2019   PLT 176 08/13/2019   CMP Latest Ref Rng & Units 01/01/2019  Glucose 70 - 99 mg/dL 149(H)  BUN 6 - 20 mg/dL 11  Creatinine 0.44 - 1.00 mg/dL 0.69  Sodium 135 - 145 mmol/L 138  Potassium 3.5 - 5.1 mmol/L 3.5  Chloride 98 - 111 mmol/L 107  CO2 22 - 32 mmol/L 20(L)  Calcium 8.9 - 10.3 mg/dL 9.2  Total Protein 6.5 - 8.1 g/dL 7.0  Total Bilirubin 0.3 - 1.2 mg/dL 0.1(L)  Alkaline Phos 38 - 126 U/L 54  AST 15 - 41 U/L 22  ALT 0 - 44 U/L 21   Edinburgh Score:  Edinburgh Postnatal Depression Scale - 08/14/19 1100       Edinburgh Postnatal Depression Scale:  In the Past 7 Days   I have been able to laugh and see the funny side of things. 0    I have looked forward with enjoyment to things. 0    I have blamed myself unnecessarily when things went wrong. 2    I have been anxious or worried for no good reason. 1    I have felt scared or panicky for no good reason. 0  Things have been getting on top of me. 1    I have been so unhappy that I have had difficulty sleeping. 0    I have felt sad or miserable. 0    I have been so unhappy that I have been crying. 0    The thought of harming myself has occurred to me. 0    Edinburgh Postnatal Depression Scale Total 4               After visit meds:  Allergies as of 08/14/2019       Reactions   Latex Rash        Medication List     STOP taking these medications    Iron Slow Release 140 (45 Fe) MG Tbcr Generic drug: Ferrous Sulfate       TAKE these medications    acetaminophen 500 MG tablet Commonly known as: TYLENOL Take 2 tablets (1,000 mg total) by mouth every 6 (six) hours as needed.   ibuprofen 600 MG tablet Commonly known as: ADVIL Take 1 tablet (600 mg total) by mouth every 6 (six) hours as needed.   metoprolol tartrate 25 MG tablet Commonly  known as: LOPRESSOR Starting after 02/14/2019-take one tablet by mouth daily.  May take an additional one tablet daily.   MULTI-VITAMIN DAILY PO Take 1 tablet by mouth daily.   senna-docusate 8.6-50 MG tablet Commonly known as: Senokot-S Take 1 tablet by mouth at bedtime. Until having normal BMs         Discharge home in stable condition Infant Feeding: Breast Infant Disposition:home with mother Discharge instruction: per After Visit Summary and Postpartum booklet. Activity: Advance as tolerated. Pelvic rest for 6 weeks.  Diet: routine diet Anticipated Birth Control: Unsure ?minipill Postpartum Appointment: 6 week Additional Postpartum F/U:  2 week perineal check Future Appointments:No future appointments. Follow up Visit:  Follow-up Information     Schuman, Christanna R, MD Follow up in 2 week(s).   Specialty: Obstetrics and Gynecology Contact information: [address] [place] [address] [phone]                     08/14/2019 Dalia Heading, CNM

## 2019-08-13 NOTE — Consult Note (Signed)
Reason for Consult:LLE weakness/numbness Requesting Physician: Bonney Aid  CC: LLE weakness/numbness  I have been asked by Dr. Bonney Aid to see this patient in consultation for LLE weakness/numbness.  HPI: Kiara Reed is a [id number] 33 y.o. female admitted at [redacted]w[redacted]d to the hospital 08/12/2019 for induction of labor.  Delivered on 6/23 with vacuum extractor required.  On first attempt to get out of bed after delivery patient noted numbness on the inner aspect of the left thigh and was unable to hold her weight due to LLE weakness.  Consult called for further recommendations.    Past Medical History:  Diagnosis Date  . SOB (shortness of breath)   . SVT (supraventricular tachycardia) (HCC)     Past Surgical History:  Procedure Laterality Date  . TONSILLECTOMY AND ADENOIDECTOMY      Family History  Problem Relation Age of Onset  . Cervical cancer Mother   . Bladder Cancer Mother   . Bipolar disorder Mother   . COPD Maternal Grandmother   . Leukemia Paternal Aunt     Social History:  reports that she has never smoked. She has never used smokeless tobacco. She reports that she does not drink alcohol and does not use drugs.  Allergies  Allergen Reactions  . Latex Rash    Medications:  I have reviewed the patient's current medications. Prior to Admission:  Medications Prior to Admission  Medication Sig Dispense Refill Last Dose  . Ferrous Sulfate (IRON SLOW RELEASE) 140 (45 Fe) MG TBCR Take 45 mg by mouth every morning.   Past Month at Unknown time  . metoprolol tartrate (LOPRESSOR) 25 MG tablet Starting after 02/14/2019-take one tablet by mouth daily.  May take an additional one tablet daily. 145 tablet 3 08/12/2019 at Unknown time  . Multiple Vitamin (MULTI-VITAMIN DAILY PO) Take 1 tablet by mouth daily.   Past Month at Unknown time   Scheduled: . acetaminophen  1,000 mg Oral Q6H  . docusate sodium  100 mg Oral BID  . ibuprofen  600 mg Oral Q6H  . metoprolol tartrate  25 mg Oral BID   . prenatal multivitamin  1 tablet Oral Q1200    ROS: History obtained from the patient  General ROS: negative for - chills, fatigue, fever, night sweats, weight gain or weight loss Psychological ROS: negative for - behavioral disorder, hallucinations, memory difficulties, mood swings or suicidal ideation Ophthalmic ROS: negative for - blurry vision, double vision, eye pain or loss of vision ENT ROS: negative for - epistaxis, nasal discharge, oral lesions, sore throat, tinnitus or vertigo Allergy and Immunology ROS: negative for - hives or itchy/watery eyes Hematological and Lymphatic ROS: negative for - bleeding problems, bruising or swollen lymph nodes Endocrine ROS: negative for - galactorrhea, hair pattern changes, polydipsia/polyuria or temperature intolerance Respiratory ROS: negative for - cough, hemoptysis, shortness of breath or wheezing Cardiovascular ROS: negative for - chest pain, dyspnea on exertion, edema or irregular heartbeat Gastrointestinal ROS: negative for - abdominal pain, diarrhea, hematemesis, nausea/vomiting or stool incontinence Genito-Urinary ROS: negative for - dysuria, hematuria, incontinence or urinary frequency/urgency Musculoskeletal ROS: negative for - joint swelling or muscular weakness Neurological ROS: as noted in HPI Dermatological ROS: negative for rash and skin lesion changes  Physical Examination: Blood pressure 133/72, pulse (!) 54, temperature 97.7 F (36.5 C), temperature source Oral, resp. rate 18, height 5\' 2"  (1.575 m), weight 103.4 kg, last menstrual period 11/02/2018, SpO2 99 %, unknown if currently breastfeeding.  HEENT-  Normocephalic, no lesions, without obvious abnormality.  Normal  external eye and conjunctiva.  Normal TM's bilaterally.  Normal auditory canals and external ears. Normal external nose, mucus membranes and septum.  Normal pharynx. Cardiovascular- S1, S2 normal, pulses palpable throughout   Lungs- chest clear, no wheezing,  rales, normal symmetric air entry Abdomen- soft, non-tender; bowel sounds normal; no masses,  no organomegaly Extremities- mild BLE edema Lymph-no adenopathy palpable Musculoskeletal-no joint tenderness, deformity or swelling Skin-warm and dry, no hyperpigmentation, vitiligo, or suspicious lesions  Neurological Examination   Mental Status: Alert, oriented, thought content appropriate.  Speech fluent without evidence of aphasia.  Able to follow 3 step commands without difficulty. Cranial Nerves: II: Discs flat bilaterally; Visual fields grossly normal, pupils equal, round, reactive to light and accommodation III,IV, VI: ptosis not present, extra-ocular motions intact bilaterally V,VII: smile symmetric, facial light touch sensation normal bilaterally VIII: hearing normal bilaterally IX,X: gag reflex present XI: bilateral shoulder shrug XII: midline tongue extension Motor: Right : Upper extremity   5/5    Left:     Upper extremity   5/5  Lower extremity   5/5     Lower extremity   5/5 Tone and bulk:normal tone throughout; no atrophy noted Sensory: Pinprick and light touch decreased on the inner aspect of the left thigh and calf Deep Tendon Reflexes: 2+ and symmetric throughout Plantars: Right: downgoing   Left: downgoing Cerebellar: Normal heel-to-shin testing bilaterally Gait: not tested due to safety concerns    Laboratory Studies:   Basic Metabolic Panel: No results for input(s): NA, K, CL, CO2, GLUCOSE, BUN, CREATININE, CALCIUM, MG, PHOS in the last 168 hours.  Liver Function Tests: No results for input(s): AST, ALT, ALKPHOS, BILITOT, PROT, ALBUMIN in the last 168 hours. No results for input(s): LIPASE, AMYLASE in the last 168 hours. No results for input(s): AMMONIA in the last 168 hours.  CBC: Recent Labs  Lab 08/12/19 1310 08/13/19 0712  WBC 8.8 20.3*  HGB 9.8* 10.4*  HCT 29.6* 31.1*  MCV 77.7* 76.0*  PLT 195 176    Cardiac Enzymes: No results for input(s):  CKTOTAL, CKMB, CKMBINDEX, TROPONINI in the last 168 hours.  BNP: Invalid input(s): POCBNP  CBG: No results for input(s): GLUCAP in the last 168 hours.  Microbiology: Results for orders placed or performed during the hospital encounter of 08/01/19  SARS CORONAVIRUS 2 (TAT 6-24 HRS) Nasopharyngeal Nasopharyngeal Swab     Status: Abnormal   Collection Time: 08/01/19  9:56 AM   Specimen: Nasopharyngeal Swab  Result Value Ref Range Status   SARS Coronavirus 2 POSITIVE (A) NEGATIVE Final    Comment: (NOTE) SARS-CoV-2 target nucleic acids are DETECTED.  The SARS-CoV-2 RNA is generally detectable in upper and lower respiratory specimens during the acute phase of infection. Positive results are indicative of the presence of SARS-CoV-2 RNA. Clinical correlation with patient history and other diagnostic information is  necessary to determine patient infection status. Positive results do not rule out bacterial infection or co-infection with other viruses.  The expected result is Negative.  Fact Sheet for Patients: SugarRoll.be  Fact Sheet for Healthcare Providers: https://www.woods-mathews.com/  This test is not yet approved or cleared by the [place] FDA and  has been authorized for detection and/or diagnosis of SARS-CoV-2 by FDA under an Emergency Use Authorization (EUA). This EUA will remain  in effect (meaning this test can be used) for the duration of the COVID-19 declaration under Section 564(b)(1) of the Act, 21 U. S.C. section 360bbb-3(b)(1), unless the authorization is terminated or revoked sooner.  Performed at [facility], [address], [place], [place] [address]     Coagulation Studies: No results for input(s): LABPROT, INR in the last 72 hours.  Urinalysis: No results for input(s): COLORURINE, LABSPEC, PHURINE, GLUCOSEU, HGBUR, BILIRUBINUR, KETONESUR, PROTEINUR, UROBILINOGEN, NITRITE, LEUKOCYTESUR in the last 168  hours.  Invalid input(s): APPERANCEUR  Lipid Panel:  No results found for: CHOL, TRIG, HDL, CHOLHDL, VLDL, LDLCALC  HgbA1C: No results found for: HGBA1C  Urine Drug Screen:  No results found for: LABOPIA, COCAINSCRNUR, LABBENZ, AMPHETMU, THCU, LABBARB  Alcohol Level: No results for input(s): ETH in the last 168 hours.   Imaging: No results found.   Assessment/Plan: 34 y.o.G1P0 female admitted at [redacted]w[redacted]d to the hospital 08/12/2019 for induction of labor.  Delivered on 6/23 with vacuum extractor required.  On first attempt to get out of bed after delivery patient noted numbness on the inner aspect of the left thigh and was unable to hold her weight due to LLE weakness.  Strength improved but continues to have numbness along the inner aspect of her LLE.  Presentation highly suggestive of a femoral neuropathy related to delivery but residual numbness is in a distribution that is not particularly characteristic of the femoral nerve.    Recommendations: 1. MRI of the lumbar spine without contrast.  If unremarkable, no further imaging recommended.  Would expect patient to continue to improve.  If patient does not return to baseline may follow up with neurology on an outpatient basis for NCV/EMG of the LLE.     Thana Farr, MD Neurology [phone] 08/13/2019, 4:13 PM

## 2019-08-13 NOTE — Progress Notes (Signed)
Pt called RN to bedside to help her to the bathroom. Pt states she walked to the bathroom this morning in labor and delivery but her left leg gave out on her when she tried to sit on the commode. Pt states her Nurse, Hali Marry was right there to support her but she feels like the thigh muscle in her left leg is still not working. RNs helped pt to bedside commode at this time. Pt tolerated well. RN to notify Ke[place], anesthesiologist of patient complaint.

## 2019-08-14 MED ORDER — SENNOSIDES-DOCUSATE SODIUM 8.6-50 MG PO TABS
1.0000 | ORAL_TABLET | Freq: Every day | ORAL | 1 refills | Status: DC
Start: 2019-08-14 — End: 2019-10-17

## 2019-08-14 MED ORDER — ACETAMINOPHEN 500 MG PO TABS: 1000.0000 mg | ORAL_TABLET | Freq: Four times a day (QID) | ORAL | 0 refills | Status: DC | PRN

## 2019-08-14 MED ORDER — ACETAMINOPHEN 500 MG PO TABS
1000.0000 mg | ORAL_TABLET | Freq: Four times a day (QID) | ORAL | Status: DC
  Administered 2019-08-14 (×2): 1000 mg via ORAL
  Filled 2019-08-14 (×2): qty 2

## 2019-08-14 MED ORDER — IBUPROFEN 600 MG PO TABS: 600.0000 mg | ORAL_TABLET | Freq: Four times a day (QID) | ORAL | 0 refills | Status: DC | PRN

## 2019-08-14 NOTE — Consult Note (Signed)
Patient seen today with r/t LLE weakness noted yesterday. Neurology followed up. MRI negative. Patient stated that the issue is currently resolved.

## 2019-08-14 NOTE — Discharge Instructions (Signed)
Discharge Instructions:   Follow-up Appointment: Thursday, July 8th at 10am with Dr. Jerene Pitch at [facility]  If there are any new medications, they have been ordered and will be available for pickup at the listed pharmacy on your way home from the hospital.   Call the office if you have any of the following: headache, visual changes, fever >101.0 F, chills, shortness of breath, breast concerns, excessive vaginal bleeding, incision drainage or problems, leg pain or redness, depression or any other concerns. If you have vaginal discharge with an odor, let your doctor know.   It is normal to bleed for up to 6 weeks. You should not soak through more than 1 pad in 1 hour. If you have a blood clot larger than your fist with continued bleeding, call your doctor.   Activity: Do not lift > 15 lbs for 6 weeks (do not lift anything heavier than your baby). No intercourse, tampons, swimming pools, hot tubs, baths (only showers) for 6 weeks.  No driving for 1-2 weeks. Do not drive while taking narcotic or opioid pain medication.  Continue taking your prenatal vitamin, especially if breastfeeding. Increase calories and fluids (water) while breastfeeding.   Your milk will come in, in the next couple of days (right now it is colostrum). You may have a slight fever when your milk comes in, but it should go away on its own.  If it does not, and rises above 101 F please call the doctor. You will also feel achy and your breasts will be firm. They will also start to leak. If you are breastfeeding, continue as you have been and you can pump/express milk for comfort.   If you have too much milk, your breasts can become engorged, which could lead to mastitis. This is an infection of the milk ducts. It can be very painful and you will need to notify your doctor to obtain a prescription for antibiotics. You can also treat it with a shower or hot/cold compress.   For concerns about your baby, please call your  pediatrician.  For breastfeeding concerns, the lactation consultant can be reached at [phone].   Postpartum blues (feelings of happy one minute and sad another minute) are normal for the first few weeks but if it gets worse let your doctor know.   Congratulations! We enjoyed caring for you and your new bundle of joy!

## 2019-08-14 NOTE — Progress Notes (Signed)
Post Partum Day 1; VAVD with 3a perineal laceration Subjective: LLE weakness has resolved and patient up ambulating without difficulty.  Tolerating a regular diet. Voiding without difficulty. Bleeding slowing. No cramping. Breast feeding using a nipple shield. Wants to be discharged after baby's circumcision later today. Objective: Blood pressure 116/63, pulse (!) 59, temperature 98.4 F (36.9 C), temperature source Oral, resp. rate 16, height 5\' 2"  (1.575 m), weight 103.4 kg, last menstrual period 11/02/2018, SpO2 98 %, unknown if currently breastfeeding.  Physical Exam:  General: alert, cooperative and no distress Lochia: appropriate Uterine Fundus: firm/ ML/ at U Perineum: intact, minimal swelling.  DVT Evaluation: No evidence of DVT seen on physical exam.  Recent Labs    08/12/19 1310 08/13/19 0712  HGB 9.8* 10.4*  HCT 29.6* 31.1*  WBC 8.8 20.3*  PLT 195 176    Assessment/Plan: Stable PPD #1 A POS/ RI/VI Breast Contraception: ?minipill TDAP=declines   LOS: 2 days   08/15/19 08/14/2019, 9:33 AM

## 2019-08-14 NOTE — Progress Notes (Signed)
Subjective: Patient reports feeling back to baseline today.  Able to walk without assistance.  No further sensory complaints.  Objective: Current vital signs: BP 116/63 (BP Location: Left Arm)   Pulse (!) 59   Temp 98.4 F (36.9 C) (Oral)   Resp 16   Ht 5\' 2"  (1.575 m)   Wt 103.4 kg   LMP 11/02/2018   SpO2 98%   Breastfeeding Unknown   BMI 41.70 kg/m  Vital signs in last 24 hours: Temp:  [97.7 F (36.5 C)-98.4 F (36.9 C)] 98.4 F (36.9 C) (06/24 0744) Pulse Rate:  [54-61] 59 (06/24 0744) Resp:  [16-20] 16 (06/24 0744) BP: (107-133)/(60-72) 116/63 (06/24 0744) SpO2:  [97 %-98 %] 98 % (06/24 0744)  Intake/Output from previous day: 06/23 0701 - 06/24 0700 In: -  Out: 1000 [Urine:1000] Intake/Output this shift: No intake/output data recorded. Nutritional status:  Diet Order            Diet regular Room service appropriate? Yes; Fluid consistency: Thin  Diet effective now                 Neurologic Exam: Mental Status: Alert, oriented, thought content appropriate.  Speech fluent without evidence of aphasia.  Able to follow 3 step commands without difficulty. Cranial Nerves: II: Discs flat bilaterally; Visual fields grossly normal, pupils equal, round, reactive to light and accommodation III,IV, VI: ptosis not present, extra-ocular motions intact bilaterally V,VII: smile symmetric, facial light touch sensation normal bilaterally VIII: hearing normal bilaterally IX,X: gag reflex present XI: bilateral shoulder shrug XII: midline tongue extension Motor: 5/5 throughout Sensory: Pinprick and light touch intact throughout, bilaterally Deep Tendon Reflexes: 2+ and symmetric throughout  Lab Results: Basic Metabolic Panel: No results for input(s): NA, K, CL, CO2, GLUCOSE, BUN, CREATININE, CALCIUM, MG, PHOS in the last 168 hours.  Liver Function Tests: No results for input(s): AST, ALT, ALKPHOS, BILITOT, PROT, ALBUMIN in the last 168 hours. No results for input(s):  LIPASE, AMYLASE in the last 168 hours. No results for input(s): AMMONIA in the last 168 hours.  CBC: Recent Labs  Lab 08/12/19 1310 08/13/19 0712  WBC 8.8 20.3*  HGB 9.8* 10.4*  HCT 29.6* 31.1*  MCV 77.7* 76.0*  PLT 195 176    Cardiac Enzymes: No results for input(s): CKTOTAL, CKMB, CKMBINDEX, TROPONINI in the last 168 hours.  Lipid Panel: No results for input(s): CHOL, TRIG, HDL, CHOLHDL, VLDL, LDLCALC in the last 168 hours.  CBG: No results for input(s): GLUCAP in the last 168 hours.  Microbiology: Results for orders placed or performed during the hospital encounter of 08/01/19  SARS CORONAVIRUS 2 (TAT 6-24 HRS) Nasopharyngeal Nasopharyngeal Swab     Status: Abnormal   Collection Time: 08/01/19  9:56 AM   Specimen: Nasopharyngeal Swab  Result Value Ref Range Status   SARS Coronavirus 2 POSITIVE (A) NEGATIVE Final    Comment: (NOTE) SARS-CoV-2 target nucleic acids are DETECTED.  The SARS-CoV-2 RNA is generally detectable in upper and lower respiratory specimens during the acute phase of infection. Positive results are indicative of the presence of SARS-CoV-2 RNA. Clinical correlation with patient history and other diagnostic information is  necessary to determine patient infection status. Positive results do not rule out bacterial infection or co-infection with other viruses.  The expected result is Negative.  Fact Sheet for Patients: 10/01/19  Fact Sheet for Healthcare Providers: HairSlick.no  This test is not yet approved or cleared by the [place] FDA and  has been authorized for detection  and/or diagnosis of SARS-CoV-2 by FDA under an Emergency Use Authorization (EUA). This EUA will remain  in effect (meaning this test can be used) for the duration of the COVID-19 declaration under Section 564(b)(1) of the Act, 21 U. S.C. section 360bbb-3(b)(1), unless the authorization is terminated  or revoked sooner.   Performed at [facility], [place] [address], [place], [place] [address]     Coagulation Studies: No results for input(s): LABPROT, INR in the last 72 hours.  Imaging: MR LUMBAR SPINE WO CONTRAST  Result Date: 08/13/2019 CLINICAL DATA:  Left lower extremity weakness and medial left thigh numbness. Recent epidural anesthesia and vaginal delivery. EXAM: MRI LUMBAR SPINE WITHOUT CONTRAST TECHNIQUE: Multiplanar, multisequence MR imaging of the lumbar spine was performed. No intravenous contrast was administered. COMPARISON:  None. FINDINGS: Segmentation: Normal lumbar segmentation is assumed with the lowest fully formed disc space designated L5-S1. Alignment:  Normal. Vertebrae: No fracture, suspicious osseous lesion, or significant marrow edema. No epidural fluid collection. Conus medullaris and cauda equina: Conus extends to the T12-L1 level. Conus and cauda equina appear normal. Paraspinal and other soft tissues: Unremarkable. Disc levels: Disc height and hydration are maintained throughout the lumbar spine. No disc herniation or stenosis is identified. IMPRESSION: Unremarkable lumbar spine MRI. Electronically Signed   By: Logan Bores M.D.   On: 08/13/2019 19:12    Medications:  I have reviewed the patient's current medications. Scheduled: . acetaminophen  1,000 mg Oral Q6H  . docusate sodium  100 mg Oral BID  . ibuprofen  600 mg Oral Q6H  . metoprolol tartrate  25 mg Oral BID  . prenatal multivitamin  1 tablet Oral Q1200    Assessment/Plan: 34 y.o.G1P0 female admitted at [redacted]w[redacted]d to the hospital 6/22/2021for induction of labor.  Delivered on 6/23 with vacuum extractor required.  On first attempt to get out of bed after delivery patient noted numbness on the inner aspect of the left thigh and was unable to hold her weight due to LLE weakness.  Strength improved but continues to have numbness along the inner aspect of her LLE.  Presentation highly suggestive of a  femoral neuropathy related to delivery but residual numbness was in a distribution that is not particularly characteristic of the femoral nerve.  MRI of the lumbar spine performed and personally reviewed.  MRI normal.  Today patient is back to baseline.    No further neurologic intervention is recommended at this time.  If further questions arise, please call or page at that time.  Thank you for allowing neurology to participate in the care of this patient.  No follow up with neurology required after discharge unless symptoms recur.      LOS: 2 days   Alexis Goodell, MD Neurology [phone] 08/14/2019  11:15 AM

## 2019-08-14 NOTE — Lactation Note (Signed)
This note was copied from a baby's chart. Lactation Consultation Note  Patient Name: Kiara Reed [id number] Date: 08/14/2019 Reason for consult: Follow-up assessment;1st time breastfeeding;Term  LC in to see mom and Kiara Reed before discharge. Feedings appear to have gone well overnight, mom feeling confident in her breastfeeding- position, placement of nipple shield, and baby being satisfied.  LC reviewed newborn feeding patterns, cluster feeding during growth spurts, tracking of wet/stool diapers w/ additional signs of adequate milk transfer, reviewed breast and nipple care for mom, breast fullness vs engorgement and breast care management. Provided signs and symptoms of plugged ducts and mastitis, and when to seek MD care.  Mom asked questions re: pumping due to early introduction for bottles while casting of baby's foot (club foot)- provided guidance for pump use, milk storage, warming and thawing, and cleaning of parts/pieces. Tips given for bottle use/introduction- paced bottle feeding.  Parents had no further questions or concerns. Encouraged to call out today before going home if something arises or for ongoing BF support.  Maternal Data Formula Feeding for Exclusion: No Has patient been taught Hand Expression?: Yes Does the patient have breastfeeding experience prior to this delivery?: No  Feeding    LATCH Score                   Interventions Interventions: Breast feeding basics reviewed  Lactation Tools Discussed/Used     Consult Status Consult Status: Complete Date: 08/14/19 Follow-up type: Call as needed    Danford Bad 08/14/2019, 10:19 AM

## 2019-08-14 NOTE — Anesthesia Postprocedure Evaluation (Signed)
Anesthesia Post Note  Patient: Kiara Reed  Procedure(s) Performed: AN AD HOC LABOR EPIDURAL  Patient location during evaluation: Mother Baby Anesthesia Type: Epidural Level of consciousness: awake and alert Pain management: pain level controlled Vital Signs Assessment: post-procedure vital signs reviewed and stable Respiratory status: spontaneous breathing, nonlabored ventilation and respiratory function stable Cardiovascular status: stable Postop Assessment: no headache, no backache and epidural receding Anesthetic complications: no   No complications documented.   Last Vitals:  Vitals:   08/13/19 2334 08/14/19 0744  BP: 110/66 116/63  Pulse: (!) 55 (!) 59  Resp: 20 16  Temp: 36.7 C 36.9 C  SpO2: 97% 98%    Last Pain:  Vitals:   08/14/19 0744  TempSrc: Oral  PainSc:                  [name]

## 2019-08-14 NOTE — Progress Notes (Signed)
Patient discharged home with infant. Discharge instructions and prescriptions given and reviewed with patient. Patient verbalized understanding. Escorted out by staff.  

## 2019-08-28 ENCOUNTER — Encounter: Payer: Self-pay | Admitting: Obstetrics and Gynecology

## 2019-08-28 ENCOUNTER — Other Ambulatory Visit: Payer: Self-pay

## 2019-08-28 ENCOUNTER — Ambulatory Visit (INDEPENDENT_AMBULATORY_CARE_PROVIDER_SITE_OTHER): Payer: 59 | Admitting: Obstetrics and Gynecology

## 2019-08-28 VITALS — BP 118/70 | HR 77 | Resp 18 | Ht 62.0 in | Wt 204.4 lb

## 2019-08-28 DIAGNOSIS — N61 Mastitis without abscess: Secondary | ICD-10-CM

## 2019-08-28 MED ORDER — DICLOXACILLIN SODIUM 500 MG PO CAPS: 500.0000 mg | ORAL_CAPSULE | Freq: Four times a day (QID) | ORAL | 0 refills | Status: AC

## 2019-08-28 NOTE — Patient Instructions (Addendum)
Sunflower Lecithin The usual recommended dosage for recurrent plugged ducts is 3600-4800 mg lecithin per day, or 1 capsule (1200 milligram) 3-4 times per day.    Breastfeeding and Human Lactation (4th ed., pp. 262-299). [place], [place]: [employer].">   Breastfeeding and Mastitis  Mastitis is inflammation of the breast tissue. It can occur in women who are breastfeeding. This can make breastfeeding painful. Mastitis will sometimes go away on its own, especially if it is not caused by an infection (non-infectious mastitis). Your health care provider will help determine if medical treatment is needed. Treatment may be needed if the condition is caused by a bacterial infection (infectious mastitis). What are the causes? This condition is often associated with a blocked milkduct, which can happen when too much milk builds up in the breast. Causes of excess milk in the breast can include:  Poor latch-on. If your baby is not latched onto the breast properly, he or she may not empty your breast completely while breastfeeding.  Allowing too much time to pass between feedings.  Wearing a bra or other clothing that is too tight. This puts extra pressure on the milk ducts so milk does not flow through them as it should.  Milk remaining in the breast because it is overfilled (engorged).  Stress and fatigue. Mastitis can also be caused by a bacterial infection. Bacteria may enter the breast tissue through cuts, cracks, or openings in the skin near the nipple area. Cracks in the skin are often caused when your baby does not latch on properly to the breast. What are the signs or symptoms? Symptoms of this condition include:  Swelling, redness, tenderness, and pain in an area of the breast. This usually affects the upper part of the breast, toward the armpit region. In most cases, it affects only one breast. In some cases, it may occur on both breasts at the same time and affect a larger  portion of breast tissue.  Swelling of the glands under the arm on the same side.  Fatigue, headache, and flu-like muscle aches.  Fever.  Rapid pulse. Symptoms usually last 2 to 5 days. Breast pain and redness are at their worst on day 2 and day 3, and they usually go away by day 5. If an infection is left to progress, a collection of pus (abscess) may develop. How is this diagnosed? This condition can be diagnosed based on your symptoms and a physical exam. You may also have tests, such as:  Blood tests to determine if your body is fighting a bacterial infection.  Mammogram or ultrasound tests to rule out other problems or diseases.  Fluid tests. If an abscess has developed, the fluid in the abscess may be removed with a needle. The fluid may be analyzed to determine if bacteria are present.  Breast milk may be cultured and tested for bacteria. How is this treated? This condition will sometimes go away on its own. Your health care provider may choose to wait 24 hours after first seeing you to decide whether treatment is needed. If treatment is needed, it may include:  Strategies to manage breastfeeding. This includes continuing to breastfeed or pump in order to allow adequate milk flow, using breast massage, and applying heat or cold to the affected area.  Self-care such as rest and increased fluid intake.  Medicine for pain.  Antibiotic medicine to treat a bacterial infection. This is usually taken by mouth.  If an abscess has developed, it may be treated  by removing fluid with a needle. Follow these instructions at home: Medicines  Take over-the-counter and prescription medicines only as told by your health care provider.  If you were prescribed an antibiotic medicine, take it as told by your health care provider. Do not stop taking the antibiotic even if you start to feel better. General instructions  Do not wear a tight or underwire bra. Wear a soft, supportive  bra.  Increase your fluid intake, especially if you have a fever.  Get plenty of rest. For breastfeeding:  Continue to empty your breasts as often as possible, either by breastfeeding or using an electric breast pump. This will lower the pressure and the pain that comes with it. Ask your health care provider if changes need to be made to your breastfeeding or pumping routine.  Keep your nipples clean and dry.  During breastfeeding, empty the first breast completely before going to the other breast. If your baby is not emptying your breasts completely, use a breast pump to empty your breasts.  Use breast massage during feeding or pumping sessions.  If directed, apply moist heat to the affected area of your breast right before breastfeeding or pumping. Use the heat source that your health care provider recommends.  If directed, put ice on the affected area of your breast right after breastfeeding or pumping: ? Put ice in a plastic bag. ? Place a towel between your skin and the bag. ? Leave the ice on for 20 minutes.  If you go back to work, pump your breasts while at work to stay in time with your nursing schedule.  Do not allow your breasts to become engorged. Contact a health care provider if:  You have pus-like discharge from the breast.  You have a fever.  Your symptoms do not improve within 2 days of starting treatment.  Your symptoms return after you have recovered from a breast infection. Get help right away if:  Your pain and swelling are getting worse.  You have pain that is not controlled with medicine.  You have a red line extending from the breast toward your armpit. Summary  Mastitis is inflammation of the breast tissue. It is often caused by a blocked milk duct or bacteria.  This condition may be treated with hot and cold compresses, medicines, self-care, and certain breastfeeding strategies.  If you were prescribed an antibiotic medicine, take it as told by  your health care provider. Do not stop taking the antibiotic even if you start to feel better.  Continue to empty your breasts as often as possible either by breastfeeding or using an electric breast pump. This information is not intended to replace advice given to you by your health care provider. Make sure you discuss any questions you have with your health care provider. Document Revised: 10/26/2017 Document Reviewed: 02/08/2016 Elsevier Patient Education  2020 [employer].

## 2019-08-28 NOTE — Progress Notes (Signed)
  OBSTETRICS POSTPARTUM CLINIC PROGRESS NOTE  Subjective:     Kiara Reed is a 34 y.o. G65P1001 female who presents for a postpartum visit. She is 3 weeks postpartum following a Term pregnancy and delivery by Vaginal problems after delivery including VAVD and 3rd degree laceration.  I have fully reviewed the prenatal and intrapartum course. Anesthesia: epidural.  Postpartum course has been complicated by complicated by mastitis and difficulty feeding on left breast.  Baby is feeding by Breast.  Bleeding: patient has not  resumed menses.  Bowel function is normal. Bladder function is normal.  Patient is not sexually active. Contraception method desired is undecided.  Postpartum depression screening: negative. Edinburgh 1.  The following portions of the patient's history were reviewed and updated as appropriate: allergies, current medications, past family history, past medical history, past social history, past surgical history and problem list.  Review of Systems Pertinent items are noted in HPI.  Objective:    BP 118/70   Pulse 77   Resp 18   Ht 5\' 2"  (1.575 m)   Wt 204 lb 6.4 oz (92.7 kg)   SpO2 97%   Breastfeeding Yes   BMI 37.39 kg/m   General:  alert and no distress   Breasts:  inspection shows erythema of the left breast. Chaffing of nipple. Patient reports tenderness bwteen 12 to 6 o'clock of the inner quadrant.  no nipple discharge or bleeding.  no masses or nodularity palpable  Lungs: clear to auscultation bilaterally  Heart:  regular rate and rhythm, S1, S2 normal, no murmur, click, rub or gallop  Abdomen: soft, non-tender; bowel sounds normal; no masses,  no organomegaly.                              Assessment:  Post Partum Care visit 1. Mastitis in female Called Lactation. They will see patient tomorrow for help with possible clogged duct in left breast.  Discussed Sunflower Lecithin supplement   - dicloxacillin (DYNAPEN) 500 MG capsule; Take 1 capsule  (500 mg total) by mouth 4 (four) times daily for 10 days.  Dispense: 40 capsule; Refill: 0  2. Postpartum care following vaginal delivery    Plan:  See orders and Patient Instructions Follow up in: 1 week or as needed.   MD, Adelene Idler OB/GYN, [facility] 08/28/2019 11:02 AM

## 2019-09-02 ENCOUNTER — Ambulatory Visit: Payer: Self-pay

## 2019-09-02 NOTE — Lactation Note (Signed)
This note was copied from a baby's chart. Lactation Consultation Note  Patient Name: Kiara Reed RUEAV'W Date: 09/02/2019     Maternal Data   Sherilyn Cooter is mom's first baby, first breastfeeding experience. Mom dx with mastitis 5 days ago, given rx for antibiotic for 10 days- to be taken 4x a day. Also recommended taking Sunflower Lecithin 1200mg  4x/day for assistance with plugged ducts. Mom reports primarily at-breast feeding, occasional pumping for dad to bottle feed; output 3-4oz/pumping session, with most of production from right breast.  Feeding   is feeding every 2-3 hours during the day, and every 4hours at night. Content between feedings, mom sometimes waking him to feed. Output exceeds expectations. Returned to birth weight prior to cast being placed 1 week ago (club foot correction). Mom is feeding with nipple shield, bilateral nipple damage prior to hospital discharge, evaluated for tongue/lip tie by pediatrician- said to be fine. (Both dad and older brother have history of lip/tongue tie).    Interventions  Weighted before/after feeding to determine milk transfer for reassurance to both mom and dad. Before Weight: 3718g After Weight: 3800g Transfer: 26ml- just less than 3oz. Feeding began on left breast (lower producer), and transferred right at 33mL's, LC recommended offering right breast at same feeding, with additional transfer of 56mL's.  Mom independent in placement of nipple shield, positioning and latching of baby. Baby grasped nipple shield and breast tissue easily, and remained alert at breast throughout feeding.   Lactation Tools Discussed/Used   Discussed continuation of nipple shield; tips and strategies for working on weaning, if possible- mom does offer without nipple shield 1x/day at this time. LC recommends when feeding begins on left breast(lower producer) to always offer right breast too, if feeding begins on right breast to offer left breast until  content and pump post feeding for additional stimulation on left breast to aid in building supply.   Consult Status  Encouraged mom to reach back out with ongoing questions, or concerns to the lactation department at [facility] 09/02/2019, 3:07 PM

## 2019-09-05 ENCOUNTER — Encounter: Payer: Self-pay | Admitting: Obstetrics and Gynecology

## 2019-09-05 ENCOUNTER — Ambulatory Visit (INDEPENDENT_AMBULATORY_CARE_PROVIDER_SITE_OTHER): Payer: 59 | Admitting: Obstetrics and Gynecology

## 2019-09-05 ENCOUNTER — Other Ambulatory Visit: Payer: Self-pay

## 2019-10-02 ENCOUNTER — Ambulatory Visit: Payer: Self-pay | Admitting: Obstetrics and Gynecology

## 2019-10-17 ENCOUNTER — Other Ambulatory Visit: Payer: Self-pay

## 2019-10-17 ENCOUNTER — Ambulatory Visit (INDEPENDENT_AMBULATORY_CARE_PROVIDER_SITE_OTHER): Payer: 59 | Admitting: Obstetrics and Gynecology

## 2019-10-17 ENCOUNTER — Encounter: Payer: Self-pay | Admitting: Obstetrics and Gynecology

## 2019-10-17 NOTE — Progress Notes (Signed)
°  OBSTETRICS POSTPARTUM CLINIC PROGRESS NOTE  Subjective:     Kiara Reed is a 34 y.o. G48P1001 female who presents for a postpartum visit. She is 2 months postpartum following a Term pregnancy and delivery by VAVD.  I have fully reviewed the prenatal and intrapartum course. Anesthesia: epidural.  Postpartum course has been complicated by uncomplicated.  Baby is feeding by Breast.  Bleeding: patient has not  resumed menses.  Bowel function is normal. Bladder function is normal.  Patient is not sexually active. Contraception method desired is none. Patient may call in a few months for OCP rx.- History of PCOS and taking letrozole for pregnancy.   Postpartum depression screening: negative. Edinburgh 2.  The following portions of the patient's history were reviewed and updated as appropriate: allergies, current medications, past family history, past medical history, past social history, past surgical history and problem list.  Review of Systems Pertinent items are noted in HPI.  Objective:    BP 120/76    Ht 5\' 2"  (1.575 m)    Wt 200 lb (90.7 kg)    Breastfeeding Yes    BMI 36.58 kg/m   General:  alert and no distress   Breasts:  inspection negative, no nipple discharge or bleeding, no masses or nodularity palpable  Lungs: clear to auscultation bilaterally  Heart:  regular rate and rhythm, S1, S2 normal, no murmur, click, rub or gallop  Abdomen: soft, non-tender; bowel sounds normal; no masses,  no organomegaly.     Vulva:  normal  Vagina: normal vagina, no discharge, exudate, lesion, or erythema  Cervix:  no cervical motion tenderness and no lesions  Corpus: normal size, contour, position, consistency, mobility, non-tender  Adnexa:  normal adnexa and no mass, fullness, tenderness  Rectal Exam: Not performed.          Assessment:  Post Partum Care visit  Plan:  See orders and Patient Instructions Follow up in: 12 months   or as needed.   MD, Adelene Idler  OB/GYN, [facility] 10/17/2019 10:45 AM

## 2019-11-12 ENCOUNTER — Other Ambulatory Visit: Payer: Self-pay

## 2019-11-12 ENCOUNTER — Encounter: Payer: Self-pay | Admitting: Internal Medicine

## 2019-11-12 ENCOUNTER — Ambulatory Visit: Payer: 59 | Admitting: Internal Medicine

## 2019-11-12 VITALS — BP 100/64 | HR 67 | Ht 62.0 in | Wt 202.2 lb

## 2019-11-12 DIAGNOSIS — I471 Supraventricular tachycardia: Secondary | ICD-10-CM | POA: Diagnosis not present

## 2019-11-12 NOTE — Progress Notes (Signed)
HPI Kiara Reed returns today for follow-up.  She is a very pleasant 34 year old woman with a long history of palpitations and documented SVT, controlled with vagal maneuvers and intravenous adenosine.  She is just delivered a healthy young child.  During her pregnancy she had recurrent SVT which was treated with beta-blocker therapy.  These episodes typically lasted only 5 to 10 minutes.  Since delivery she has done well with only occasional palpitations.  She was somewhat anxious about recurrent tachycardia in the planning of a second pregnancy.  She denies syncope or near syncope.  No chest pain or shortness of breath. Allergies  Allergen Reactions   Latex Rash     No current outpatient medications on file.   No current facility-administered medications for this visit.     Past Medical History:  Diagnosis Date   SOB (shortness of breath)    SVT (supraventricular tachycardia) (HCC)     ROS:   All systems reviewed and negative except as noted in the HPI.   Past Surgical History:  Procedure Laterality Date   TONSILLECTOMY AND ADENOIDECTOMY       Family History  Problem Relation Age of Onset   Cervical cancer Mother    Bladder Cancer Mother    Bipolar disorder Mother    COPD Maternal Grandmother    Leukemia Paternal Aunt      Social History   Socioeconomic History   Marital status: Married    Spouse name: Micheal   Number of children: Not on file   Years of education: Not on file   Highest education level: Not on file  Occupational History   Occupation: Nature conservation officer  Tobacco Use   Smoking status: Never Smoker   Smokeless tobacco: Never Used  Building services engineer Use: Never used  Substance and Sexual Activity   Alcohol use: Never   Drug use: Never   Sexual activity: Yes    Birth control/protection: None  Other Topics Concern   Not on file  Social History Narrative   Not on file   Social Determinants of Health    Financial Resource Strain:    Difficulty of Paying Living Expenses: Not on file  Food Insecurity:    Worried About Programme researcher, broadcasting/film/video in the Last Year: Not on file   [employer] of Food in the Last Year: Not on file  Transportation Needs:    Lack of Transportation (Medical): Not on file   Lack of Transportation (Non-Medical): Not on file  Physical Activity:    Days of Exercise per Week: Not on file   Minutes of Exercise per Session: Not on file  Stress:    Feeling of Stress : Not on file  Social Connections:    Frequency of Communication with Friends and Family: Not on file   Frequency of Social Gatherings with Friends and Family: Not on file   Attends Religious Services: Not on file   Active Member of Clubs or Organizations: Not on file   Attends Banker Meetings: Not on file   Marital Status: Not on file  Intimate Partner Violence:    Fear of Current or Ex-Partner: Not on file   Emotionally Abused: Not on file   Physically Abused: Not on file   Sexually Abused: Not on file     BP 100/64    Pulse 67    Ht 5\' 2"  (1.575 m)    Wt 202 lb 3.2 oz (91.7 kg)  SpO2 95%    BMI 36.98 kg/m   Physical Exam:  Well appearing 34 year old woman, NAD HEENT: Unremarkable Neck:  No JVD, no thyromegally Lymphatics:  No adenopathy Back:  No CVA tenderness Lungs:  Clear, with no wheezes, rales, or rhonchi HEART:  Regular rate rhythm, no murmurs, no rubs, no clicks Abd:  soft, positive bowel sounds, no organomegally, no rebound, no guarding Ext:  2 plus pulses, no edema, no cyanosis, no clubbing Skin:  No rashes no nodules Neuro:  CN II through XII intact, motor grossly intact  EKG -normal sinus rhythm with no ventricular preexcitation  DEVICE  Normal device function.  See PaceArt for details.   Assess/Plan: 1.  SVT -I have discussed the treatment options with the patient.  The risks, goals, benefits, and expectations of EP study and catheter ablation  were reviewed with the patient and she wishes to proceed.  She will hold her beta-blocker for 2 days prior to the procedure.  Sharrell Ku, MD

## 2019-11-12 NOTE — Patient Instructions (Addendum)
Medication Instructions:  Your physician recommends that you continue on your current medications as directed. Please refer to the Current Medication list given to you today.  Labwork: None ordered.  Testing/Procedures: None ordered.  Follow-Up:  SEE INSTRUCTION LETTER  Any Other Special Instructions Will Be Listed Below (If Applicable).  If you need a refill on your cardiac medications before your next appointment, please call your pharmacy.    Cardiac Ablation Cardiac ablation is a procedure to disable (ablate) a small amount of heart tissue in very specific places. The heart has many electrical connections. Sometimes these connections are abnormal and can cause the heart to beat very fast or irregularly. Ablating some of the problem areas can improve the heart rhythm or return it to normal. Ablation may be done for people who:  Have Wolff-Parkinson-White syndrome.  Have fast heart rhythms (tachycardia).  Have taken medicines for an abnormal heart rhythm (arrhythmia) that were not effective or caused side effects.  Have a high-risk heartbeat that may be life-threatening. During the procedure, a small incision is made in the neck or the groin, and a long, thin, flexible tube (catheter) is inserted into the incision and moved to the heart. Small devices (electrodes) on the tip of the catheter will send out electrical currents. A type of X-ray (fluoroscopy) will be used to help guide the catheter and to provide images of the heart. Tell a health care provider about:  Any allergies you have.  All medicines you are taking, including vitamins, herbs, eye drops, creams, and over-the-counter medicines.  Any problems you or family members have had with anesthetic medicines.  Any blood disorders you have.  Any surgeries you have had.  Any medical conditions you have, such as kidney failure.  Whether you are pregnant or may be pregnant. What are the risks? Generally, this is a  safe procedure. However, problems may occur, including:  Infection.  Bruising and bleeding at the catheter insertion site.  Bleeding into the chest, especially into the sac that surrounds the heart. This is a serious complication.  Stroke or blood clots.  Damage to other structures or organs.  Allergic reaction to medicines or dyes.  Need for a permanent pacemaker if the normal electrical system is damaged. A pacemaker is a small computer that sends electrical signals to the heart and helps your heart beat normally.  The procedure not being fully effective. This may not be recognized until months later. Repeat ablation procedures are sometimes required. What happens before the procedure?  Follow instructions from your health care provider about eating or drinking restrictions.  Ask your health care provider about: ? Changing or stopping your regular medicines. This is especially important if you are taking diabetes medicines or blood thinners. ? Taking medicines such as aspirin and ibuprofen. These medicines can thin your blood. Do not take these medicines before your procedure if your health care provider instructs you not to.  Plan to have someone take you home from the hospital or clinic.  If you will be going home right after the procedure, plan to have someone with you for 24 hours. What happens during the procedure?  To lower your risk of infection: ? Your health care team will wash or sanitize their hands. ? Your skin will be washed with soap. ? Hair may be removed from the incision area.  An IV tube will be inserted into one of your veins.  You will be given a medicine to help you relax (sedative).  The   skin on your neck or groin will be numbed.  An incision will be made in your neck or your groin.  A needle will be inserted through the incision and into a large vein in your neck or groin.  A catheter will be inserted into the needle and moved to your  heart.  Dye may be injected through the catheter to help your surgeon see the area of the heart that needs treatment.  Electrical currents will be sent from the catheter to ablate heart tissue in desired areas. There are three types of energy that may be used to ablate heart tissue: ? Heat (radiofrequency energy). ? Laser energy. ? Extreme cold (cryoablation).  When the necessary tissue has been ablated, the catheter will be removed.  Pressure will be held on the catheter insertion area to prevent excessive bleeding.  A bandage (dressing) will be placed over the catheter insertion area. The procedure may vary among health care providers and hospitals. What happens after the procedure?  Your blood pressure, heart rate, breathing rate, and blood oxygen level will be monitored until the medicines you were given have worn off.  Your catheter insertion area will be monitored for bleeding. You will need to lie still for a few hours to ensure that you do not bleed from the catheter insertion area.  Do not drive for 24 hours or as long as directed by your health care provider. Summary  Cardiac ablation is a procedure to disable (ablate) a small amount of heart tissue in very specific places. Ablating some of the problem areas can improve the heart rhythm or return it to normal.  During the procedure, electrical currents will be sent from the catheter to ablate heart tissue in desired areas. This information is not intended to replace advice given to you by your health care provider. Make sure you discuss any questions you have with your health care provider. Document Revised: 07/30/2017 Document Reviewed: 12/27/2015 Elsevier Patient Education  2020 [employer]  

## 2019-11-18 ENCOUNTER — Other Ambulatory Visit: Payer: Self-pay | Admitting: Obstetrics and Gynecology

## 2019-11-18 DIAGNOSIS — Z9189 Other specified personal risk factors, not elsewhere classified: Secondary | ICD-10-CM

## 2019-11-18 MED ORDER — METOCLOPRAMIDE HCL 10 MG PO TABS: 10.0000 mg | ORAL_TABLET | Freq: Three times a day (TID) | ORAL | 1 refills | Status: AC

## 2019-12-22 ENCOUNTER — Other Ambulatory Visit: Payer: Self-pay

## 2019-12-22 ENCOUNTER — Other Ambulatory Visit
Admission: RE | Admit: 2019-12-22 | Discharge: 2019-12-22 | Disposition: A | Discharge status: Home / Self Care | Payer: 59 | Source: Ambulatory Visit | Attending: Internal Medicine | Admitting: Internal Medicine

## 2019-12-22 DIAGNOSIS — Z20822 Contact with and (suspected) exposure to covid-19: Secondary | ICD-10-CM | POA: Insufficient documentation

## 2019-12-22 DIAGNOSIS — Z01812 Encounter for preprocedural laboratory examination: Secondary | ICD-10-CM | POA: Diagnosis not present

## 2019-12-22 LAB — SARS CORONAVIRUS 2 (TAT 6-24 HRS): SARS Coronavirus 2: NEGATIVE

## 2019-12-23 ENCOUNTER — Encounter: Payer: Self-pay | Admitting: Internal Medicine

## 2019-12-23 NOTE — Progress Notes (Signed)
Called patient to go over procedure instructions for tomorrow.  Left voicemail, nothing to eat or drink after midnight, need responsible adult to drive you home and stay with you for 24 hrs.  No medications in the morning

## 2019-12-24 ENCOUNTER — Other Ambulatory Visit: Payer: Self-pay

## 2019-12-24 ENCOUNTER — Encounter (HOSPITAL_COMMUNITY): Payer: Self-pay | Admitting: Internal Medicine

## 2019-12-24 ENCOUNTER — Ambulatory Visit (HOSPITAL_COMMUNITY)
Admission: RE | Admit: 2019-12-24 | Discharge: 2019-12-25 | Disposition: A | Discharge status: Home / Self Care | Payer: 59 | Attending: Internal Medicine | Admitting: Internal Medicine

## 2019-12-24 ENCOUNTER — Encounter (HOSPITAL_COMMUNITY)
Admission: RE | Disposition: A | Discharge status: Home / Self Care | Payer: Self-pay | Source: Home / Self Care | Attending: Internal Medicine

## 2019-12-24 DIAGNOSIS — I471 Supraventricular tachycardia, unspecified: Secondary | ICD-10-CM | POA: Diagnosis present

## 2019-12-24 HISTORY — PX: SVT ABLATION: EP1225

## 2019-12-24 LAB — PREGNANCY, URINE: Preg Test, Ur: NEGATIVE

## 2019-12-24 LAB — BASIC METABOLIC PANEL
Anion gap: 8 (ref 5–15)
BUN: 18 mg/dL (ref 6–20)
CO2: 24 mmol/L (ref 22–32)
Calcium: 9.3 mg/dL (ref 8.9–10.3)
Chloride: 108 mmol/L (ref 98–111)
Creatinine, Ser: 0.68 mg/dL (ref 0.44–1.00)
GFR, Estimated: >60 mL/min (ref >60)
Glucose, Bld: 103 mg/dL — ABNORMAL HIGH (ref 70–99)
Potassium: 4.1 mmol/L (ref 3.5–5.1)
Sodium: 140 mmol/L (ref 135–145)

## 2019-12-24 LAB — CBC
HCT: 38.4 % (ref 36.0–46.0)
Hemoglobin: 12 g/dL (ref 12.0–15.0)
MCH: 25.6 pg — ABNORMAL LOW (ref 26.0–34.0)
MCHC: 31.3 g/dL (ref 30.0–36.0)
MCV: 81.9 fL (ref 80.0–100.0)
Platelets: 285 10*3/uL (ref 150–400)
RBC: 4.69 MIL/uL (ref 3.87–5.11)
RDW: 14 % (ref 11.5–15.5)
WBC: 7.7 10*3/uL (ref 4.0–10.5)
nRBC: 0 % (ref 0.0–0.2)

## 2019-12-24 LAB — POCT ACTIVATED CLOTTING TIME
Activated Clotting Time: 202 seconds
Activated Clotting Time: 147 seconds

## 2019-12-24 SURGERY — SVT ABLATION

## 2019-12-24 MED ORDER — ACETAMINOPHEN 325 MG PO TABS
650.0000 mg | ORAL_TABLET | ORAL | Status: DC | PRN
  Administered 2019-12-24: 650 mg via ORAL
  Filled 2019-12-24: qty 2

## 2019-12-24 MED ORDER — LIDOCAINE HCL (PF) 1 % IJ SOLN
INTRAMUSCULAR | Status: DC | PRN
  Administered 2019-12-24: 30 mL

## 2019-12-24 MED ORDER — MIDAZOLAM HCL 5 MG/5ML IJ SOLN
INTRAMUSCULAR | Status: AC
  Filled 2019-12-24: qty 5

## 2019-12-24 MED ORDER — FENTANYL CITRATE (PF) 100 MCG/2ML IJ SOLN
INTRAMUSCULAR | Status: AC
  Filled 2019-12-24: qty 2

## 2019-12-24 MED ORDER — SODIUM CHLORIDE 0.9% FLUSH
3.0000 mL | Freq: Two times a day (BID) | INTRAVENOUS | Status: DC
  Administered 2019-12-24: 3 mL via INTRAVENOUS

## 2019-12-24 MED ORDER — FENTANYL CITRATE (PF) 100 MCG/2ML IJ SOLN
25.0000 ug | Freq: Once | INTRAMUSCULAR | Status: DC
Start: 2019-12-24 — End: 2019-12-24

## 2019-12-24 MED ORDER — MIDAZOLAM HCL 5 MG/5ML IJ SOLN
INTRAMUSCULAR | Status: DC | PRN
  Administered 2019-12-24: 2 mg via INTRAVENOUS
  Administered 2019-12-24 (×2): 1 mg via INTRAVENOUS
  Administered 2019-12-24 (×4): 2 mg via INTRAVENOUS
  Administered 2019-12-24: 1 mg via INTRAVENOUS
  Administered 2019-12-24: 2 mg via INTRAVENOUS
  Administered 2019-12-24: 1 mg via INTRAVENOUS

## 2019-12-24 MED ORDER — ISOPROTERENOL HCL 0.2 MG/ML IJ SOLN
INTRAMUSCULAR | Status: AC
  Filled 2019-12-24: qty 5

## 2019-12-24 MED ORDER — SODIUM CHLORIDE 0.9 % IV SOLN: INTRAVENOUS | Status: DC

## 2019-12-24 MED ORDER — HEPARIN (PORCINE) IN NACL 1000-0.9 UT/500ML-% IV SOLN
INTRAVENOUS | Status: AC
  Filled 2019-12-24: qty 500

## 2019-12-24 MED ORDER — ONDANSETRON HCL 4 MG/2ML IJ SOLN: 4.0000 mg | Freq: Four times a day (QID) | INTRAMUSCULAR | Status: DC | PRN

## 2019-12-24 MED ORDER — METOPROLOL TARTRATE 5 MG/5ML IV SOLN
INTRAVENOUS | Status: AC
  Filled 2019-12-24: qty 5

## 2019-12-24 MED ORDER — SODIUM CHLORIDE 0.9% FLUSH: 3.0000 mL | INTRAVENOUS | Status: DC | PRN

## 2019-12-24 MED ORDER — HEPARIN SODIUM (PORCINE) 1000 UNIT/ML IJ SOLN
INTRAMUSCULAR | Status: AC
  Filled 2019-12-24: qty 1

## 2019-12-24 MED ORDER — HEPARIN SODIUM (PORCINE) 1000 UNIT/ML IJ SOLN
INTRAMUSCULAR | Status: DC | PRN
  Administered 2019-12-24: 3000 [IU] via INTRAVENOUS
  Administered 2019-12-24: 6000 [IU] via INTRAVENOUS

## 2019-12-24 MED ORDER — SODIUM CHLORIDE 0.9 % IV SOLN: 250.0000 mL | INTRAVENOUS | Status: DC | PRN

## 2019-12-24 MED ORDER — SODIUM CHLORIDE 0.9 % IV SOLN
INTRAVENOUS | Status: DC | PRN
  Administered 2019-12-24: 4 ug/min via INTRAVENOUS

## 2019-12-24 MED ORDER — HEPARIN (PORCINE) IN NACL 1000-0.9 UT/500ML-% IV SOLN
INTRAVENOUS | Status: DC | PRN
  Administered 2019-12-24: 500 mL

## 2019-12-24 MED ORDER — FENTANYL CITRATE (PF) 100 MCG/2ML IJ SOLN
INTRAMUSCULAR | Status: DC | PRN
  Administered 2019-12-24 (×4): 25 ug via INTRAVENOUS
  Administered 2019-12-24 (×4): 12.5 ug via INTRAVENOUS
  Administered 2019-12-24: 25 ug via INTRAVENOUS

## 2019-12-24 MED ORDER — LIDOCAINE HCL (PF) 1 % IJ SOLN
INTRAMUSCULAR | Status: AC
  Filled 2019-12-24: qty 60

## 2019-12-24 SURGICAL SUPPLY — 12 items
BAG SNAP BAND KOVER 36X36 (MISCELLANEOUS) ×3 IMPLANT
CATH CELSIUS THERM B CV 7F (ABLATOR) ×3 IMPLANT
CATH HEX JOS 2-5-2 65CM 6F REP (CATHETERS) ×3 IMPLANT
CATH JOSEPH QUAD ALLRED 6F REP (CATHETERS) ×6 IMPLANT
COVER DOME SNAP 22 D (MISCELLANEOUS) ×3 IMPLANT
MAT PREVALON FULL STRYKER (MISCELLANEOUS) ×3 IMPLANT
PACK EP LATEX FREE (CUSTOM PROCEDURE TRAY) ×2
PACK EP LF (CUSTOM PROCEDURE TRAY) ×1 IMPLANT
PAD PRO RADIOLUCENT 2001M-C (PAD) ×3 IMPLANT
SHEATH PINNACLE 6F 10CM (SHEATH) ×6 IMPLANT
SHEATH PINNACLE 7F 10CM (SHEATH) ×3 IMPLANT
SHEATH PINNACLE 8F 10CM (SHEATH) ×6 IMPLANT

## 2019-12-24 NOTE — H&P (Signed)
HPI Kiara Reed returns today for follow-up.  She is a very pleasant 34 year old woman with a long history of palpitations and documented SVT, controlled with vagal maneuvers and intravenous adenosine.  She is just delivered a healthy young child.  During her pregnancy she had recurrent SVT which was treated with beta-blocker therapy.  These episodes typically lasted only 5 to 10 minutes.  Since delivery she has done well with only occasional palpitations.  She was somewhat anxious about recurrent tachycardia in the planning of a second pregnancy.  She denies syncope or near syncope.  No chest pain or shortness of breath. Allergies  Allergen Reactions  . Latex Rash     No current outpatient medications on file.   No current facility-administered medications for this visit.         Past Medical History:  Diagnosis Date  . SOB (shortness of breath)   . SVT (supraventricular tachycardia) (HCC)     ROS:   All systems reviewed and negative except as noted in the HPI.        Past Surgical History:  Procedure Laterality Date  . TONSILLECTOMY AND ADENOIDECTOMY            Family History  Problem Relation Age of Onset  . Cervical cancer Mother   . Bladder Cancer Mother   . Bipolar disorder Mother   . COPD Maternal Grandmother   . Leukemia Paternal Aunt      Social History        Socioeconomic History  . Marital status: Married    Spouse name: Kiara Reed  . Number of children: Not on file  . Years of education: Not on file  . Highest education level: Not on file  Occupational History  . Occupation: Nature conservation officer  Tobacco Use  . Smoking status: Never Smoker  . Smokeless tobacco: Never Used  Vaping Use  . Vaping Use: Never used  Substance and Sexual Activity  . Alcohol use: Never  . Drug use: Never  . Sexual activity: Yes    Birth control/protection: None  Other Topics Concern  . Not on file  Social History Narrative  . Not  on file   Social Determinants of Health      Financial Resource Strain:   . Difficulty of Paying Living Expenses: Not on file  Food Insecurity:   . Worried About Programme researcher, broadcasting/film/video in the Last Year: Not on file  . Ran Out of Food in the Last Year: Not on file  Transportation Needs:   . Lack of Transportation (Medical): Not on file  . Lack of Transportation (Non-Medical): Not on file  Physical Activity:   . Days of Exercise per Week: Not on file  . Minutes of Exercise per Session: Not on file  Stress:   . Feeling of Stress : Not on file  Social Connections:   . Frequency of Communication with Friends and Family: Not on file  . Frequency of Social Gatherings with Friends and Family: Not on file  . Attends Religious Services: Not on file  . Active Member of Clubs or Organizations: Not on file  . Attends Banker Meetings: Not on file  . Marital Status: Not on file  Intimate Partner Violence:   . Fear of Current or Ex-Partner: Not on file  . Emotionally Abused: Not on file  . Physically Abused: Not on file  . Sexually Abused: Not on file     BP 100/64   Pulse 67  Ht 5\' 2"  (1.575 m)   Wt 202 lb 3.2 oz (91.7 kg)   SpO2 95%   BMI 36.98 kg/m   Physical Exam:  Well appearing 34 year old woman, NAD HEENT: Unremarkable Neck:  No JVD, no thyromegally Lymphatics:  No adenopathy Back:  No CVA tenderness Lungs:  Clear, with no wheezes, rales, or rhonchi HEART:  Regular rate rhythm, no murmurs, no rubs, no clicks Abd:  soft, positive bowel sounds, no organomegally, no rebound, no guarding Ext:  2 plus pulses, no edema, no cyanosis, no clubbing Skin:  No rashes no nodules Neuro:  CN II through XII intact, motor grossly intact  EKG -normal sinus rhythm with no ventricular preexcitation  DEVICE  Normal device function.  See PaceArt for details.   Assess/Plan: 1.  SVT -I have discussed the treatment options with the patient.  The risks, goals,  benefits, and expectations of EP study and catheter ablation were reviewed with the patient and she wishes to proceed.  She will hold her beta-blocker for 2 days prior to the procedure.  36, MD  EP Attending  Patient seen and examined. Agree with above. The patient presents for EP study and catheter ablation. I have reviewed the risks/benefits/goals/expectations and she wishes to proceed.   Kiara Ku Gerlean Cid,MD

## 2019-12-24 NOTE — Progress Notes (Addendum)
Site area- right femoral  Site Prior to Removal- 0   Pressure Applied For-  25 MInutes   Bedrest Beginning at - 1300   Manual- Yes   Patient Status During Pull- Stable    Post Pull Groin Site- 0   Post Pull Instructions Given- Yes   Post Pull Pulses Present- Yes    Dressing Applied- Tegaderm and Gauze Dressing    Comments:  1 arterial sheath and 3 venous sheaths pulled from right femoral site.

## 2019-12-25 ENCOUNTER — Encounter (HOSPITAL_COMMUNITY): Payer: Self-pay | Admitting: Internal Medicine

## 2019-12-25 DIAGNOSIS — I471 Supraventricular tachycardia: Secondary | ICD-10-CM | POA: Diagnosis not present

## 2019-12-25 MED ORDER — METOPROLOL TARTRATE 50 MG PO TABS: 50.0000 mg | ORAL_TABLET | ORAL | 2 refills | Status: AC | PRN

## 2019-12-25 NOTE — Discharge Instructions (Signed)

## 2019-12-25 NOTE — Discharge Summary (Addendum)
ELECTROPHYSIOLOGY PROCEDURE DISCHARGE SUMMARY    Patient ID: Kiara Reed,  MRN: [id number], DOB/AGE: [birth year] 34 y.o.  Admit date: 12/24/2019 Discharge date: 12/25/2019  Primary Care Physician: Carren Rang, PA-C  Primary Cardiologist: Debbe Odea, MD  Electrophysiologist: Dr. Ladona Ridgel  Primary Discharge Diagnosis:  Supra-ventricular Tachycardia  Procedures This Admission:  1. Unsuccessful Electrophysiology study and radiofrequency catheter ablation of a SVT on 12/24/2019 by Dr. Ladona Ridgel.   This study demonstrated a concealed left posterior septal accessory pathway location not being amenable to ablation, catheter localization, and positioning.  Brief HPI: Kiara Reed is a 34 y.o. female with a history of  Supra-ventricular Tachycardia.  Risks, benefits, and alternatives to catheter ablation of Supra-ventricular Tachycardia were reviewed with the patient who wished to proceed.    Hospital Course:  The patient was admitted and underwent EPS/RFCA of Supra-ventricular Tachycardia with details as outlined above.  They were monitored on telemetry overnight which demonstrated NSR and sinus tach.  Groin was without complication on the day of discharge.  The patient was examined and considered to be stable for discharge.  Wound care and restrictions were reviewed with the patient.  The patient will be seen back by  Dr. Ladona Ridgel.   in 4 weeks. We will manage her on short acting lopressor, until the time that she may become pregnant again. We would plan on managing her in the later trimesters of pregnancy with long acting BB.   Physical Exam: Vitals:   12/24/19 1448 12/24/19 2039 12/24/19 2355 12/25/19 0549  BP: 102/75 109/74 (!) 103/55 96/70  Pulse:  92 77 92  Resp:  19 15 17   Temp:  98.4 F (36.9 C) 97.9 F (36.6 C) 97.9 F (36.6 C)  TempSrc:  Oral Oral Oral  SpO2:  100% 100%   Weight:    91.2 kg  Height:        GEN- The patient is well appearing, alert and oriented x 3  today.   HEENT: normocephalic, atraumatic; sclera clear, conjunctiva pink; hearing intact; oropharynx clear; neck supple  Lungs- Clear to ausculation bilaterally, normal work of breathing.  No wheezes, rales, rhonchi Heart- Regular rate and rhythm, no murmurs, rubs or gallops  GI- soft, non-tender, non-distended, bowel sounds present  Extremities- no clubbing, cyanosis, or edema; DP/PT/radial pulses 2+ bilaterally, groin without hematoma/bruit MS- no significant deformity or atrophy Skin- warm and dry, no rash or lesion Psych- euthymic mood, full affect Neuro- strength and sensation are intact   Labs:   Lab Results  Component Value Date   WBC 7.7 12/24/2019   HGB 12.0 12/24/2019   HCT 38.4 12/24/2019   MCV 81.9 12/24/2019   PLT 285 12/24/2019    Recent Labs  Lab 12/24/19 0646  NA 140  K 4.1  CL 108  CO2 24  BUN 18  CREATININE 0.68  CALCIUM 9.3  GLUCOSE 103*     Discharge Medications:  Allergies as of 12/25/2019       Reactions   Latex Rash        Medication List     TAKE these medications    acetaminophen 325 MG tablet Commonly known as: TYLENOL Take 650 mg by mouth every 6 (six) hours as needed (pain.).   metoCLOPramide 10 MG tablet Commonly known as: REGLAN Take 1 tablet (10 mg total) by mouth 3 (three) times daily with meals.   metoprolol tartrate 50 MG tablet Commonly known as: LOPRESSOR Take 1 tablet (50 mg total) by mouth as needed (palpitations).  Disposition:     Follow-up Information     Marinus Maw, MD Follow up on 01/21/2020.   Specialty: Cardiology Why: at 1230 for post ablation follow up Contact information: [address] [place] [address] [phone]                 Duration of Discharge Encounter: Greater than 30 minutes including physician time.  Dustin Flock, PA-C  12/25/2019 7:42 AM  EP Attending  Patient seen and examined. Agree with the findings as noted  above. The patient has been stable overnight with no recurrent SVT. He groin and neck are without hematoma or ecchymosis. I have discussed the planned treatment options with the patient and she will take as needed beta blocker therapy for now, and if/when she becomes pregnant start toprol in the second trimester.  Sharlot Gowda Conya Ellinwood,MD

## 2020-01-21 ENCOUNTER — Ambulatory Visit: Payer: 59 | Admitting: Internal Medicine

## 2020-01-21 ENCOUNTER — Encounter: Payer: Self-pay | Admitting: Internal Medicine

## 2020-01-21 ENCOUNTER — Other Ambulatory Visit: Payer: Self-pay

## 2020-01-21 VITALS — BP 100/62 | HR 67 | Ht 62.0 in | Wt 208.2 lb

## 2020-01-21 DIAGNOSIS — I471 Supraventricular tachycardia: Secondary | ICD-10-CM | POA: Diagnosis not present

## 2020-01-21 NOTE — Patient Instructions (Addendum)

## 2020-01-21 NOTE — Progress Notes (Signed)
HPI Kiara Reed returns today for followup of her SVT. She is a pleasant 34 yo woman with a concealed Epicardial left posteroseptal AP for which catheter ablation was unsuccessful in curing her SVT about a month ago. In the interim she has had an occaisional sensation like her heart was about to start racing but then did not. She has had no trouble taking her beta blocker.  Allergies  Allergen Reactions  . Latex Rash     Current Outpatient Medications  Medication Sig Dispense Refill  . acetaminophen (TYLENOL) 325 MG tablet Take 650 mg by mouth every 6 (six) hours as needed (pain.).    Marland Kitchen metoCLOPramide (REGLAN) 10 MG tablet Take 1 tablet (10 mg total) by mouth 3 (three) times daily with meals. 90 tablet 1  . metoprolol tartrate (LOPRESSOR) 50 MG tablet Take 1 tablet (50 mg total) by mouth as needed (palpitations). 30 tablet 2   No current facility-administered medications for this visit.     Past Medical History:  Diagnosis Date  . SOB (shortness of breath)   . SVT (supraventricular tachycardia) (HCC)     ROS:   All systems reviewed and negative except as noted in the HPI.   Past Surgical History:  Procedure Laterality Date  . SVT ABLATION N/A 12/24/2019   Procedure: SVT ABLATION;  Surgeon: Marinus Maw, MD;  Location: [facility] INVASIVE CV LAB;  Service: Cardiovascular;  Laterality: N/A;  . TONSILLECTOMY AND ADENOIDECTOMY       Family History  Problem Relation Age of Onset  . Bladder Cancer Mother   . Bipolar disorder Mother   . Cancer - Other Mother   . COPD Maternal Grandmother   . Leukemia Paternal Aunt      Social History   Socioeconomic History  . Marital status: Married    Spouse name: Micheal  . Number of children: 2  . Years of education: 10  . Highest education level: High school graduate  Occupational History  . Occupation: Nature conservation officer  Tobacco Use  . Smoking status: Never Smoker  . Smokeless tobacco: Never Used  Vaping Use  . Vaping  Use: Never used  Substance and Sexual Activity  . Alcohol use: Never  . Drug use: Never  . Sexual activity: Yes    Birth control/protection: None  Other Topics Concern  . Not on file  Social History Narrative  . Not on file   Social Determinants of Health   Financial Resource Strain: Low Risk   . Difficulty of Paying Living Expenses: Not hard at all  Food Insecurity: No Food Insecurity  . Worried About Programme researcher, broadcasting/film/video in the Last Year: Never true  . Ran Out of Food in the Last Year: Never true  Transportation Needs: No Transportation Needs  . Lack of Transportation (Medical): No  . Lack of Transportation (Non-Medical): No  Physical Activity: Insufficiently Active  . Days of Exercise per Week: 2 days  . Minutes of Exercise per Session: 30 min  Stress: No Stress Concern Present  . Feeling of Stress : Only a little  Social Connections: Moderately Integrated  . Frequency of Communication with Friends and Family: More than three times a week  . Frequency of Social Gatherings with Friends and Family: Once a week  . Attends Religious Services: More than 4 times per year  . Active Member of Clubs or Organizations: No  . Attends Banker Meetings: Never  . Marital Status: Married  Catering manager  Violence: Not At Risk  . Fear of Current or Ex-Partner: No  . Emotionally Abused: No  . Physically Abused: No  . Sexually Abused: No     BP 100/62   Pulse 67   Ht 5\' 2"  (1.575 m)   Wt 208 lb 3.2 oz (94.4 kg)   SpO2 95%   BMI 38.08 kg/m   Physical Exam:  Well appearing NAD HEENT: Unremarkable Neck:  No JVD, no thyromegally Lymphatics:  No adenopathy Back:  No CVA tenderness Lungs:  Clear with no wheezes HEART:  Regular rate rhythm, no murmurs, no rubs, no clicks Abd:  soft, positive bowel sounds, no organomegally, no rebound, no guarding Ext:  2 plus pulses, no edema, no cyanosis, no clubbing Skin:  No rashes no nodules Neuro:  CN II through XII intact,  motor grossly intact  EKG - none   Assess/Plan: 1. SVT - she s/p attempted unsuccessful ablation of a concealed left posteroseptal epicardial AP for which we could terminate the tachycardia/transiently eliminate retrograde AP conduction but which always recurred. She is stable and will continue her beta blocker. We discussed epicardial access which I do not recommend as well as other AA drugs. For now she will continue her beta blocker. If she decides to become pregnant she will hold her beta blocker during the first trimester of pregnancy.   Zoye Chandra,MD

## 2020-03-10 NOTE — Progress Notes (Signed)
Appointment Information:     Visit Type: Postpartum         Date: 09/05/2019                 Dept: [facility]                 Provider: Natale Milch                 Time: 3:50 PM                 Length: 20 min   Appt Status: Scheduled

## 2020-05-06 NOTE — Progress Notes (Signed)
Postpartum Visit  Chief Complaint:  Chief Complaint  Patient presents with  . Postpartum Care    History of Present Illness: Patient is a 35 y.o. G1P1001 presents for postpartum visit.  Lochia: normale Post partum depression/anxiety noted:  no Any problems since the delivery:  no  Newborn Details:  SINGLETON :  Infant Status: Infant doing well at home with mother.   Review of Systems: ROS   Past Medical History:  Past Medical History:  Diagnosis Date  . SOB (shortness of breath)   . SVT (supraventricular tachycardia) (HCC)     Past Surgical History:  Past Surgical History:  Procedure Laterality Date  . SVT ABLATION N/A 12/24/2019   Procedure: SVT ABLATION;  Surgeon: Marinus Maw, MD;  Location: [facility] INVASIVE CV LAB;  Service: Cardiovascular;  Laterality: N/A;  . TONSILLECTOMY AND ADENOIDECTOMY      Family History:  Family History  Problem Relation Age of Onset  . Bladder Cancer Mother   . Bipolar disorder Mother   . Cancer - Other Mother   . COPD Maternal Grandmother   . Leukemia Paternal Aunt     Social History:  Social History   Socioeconomic History  . Marital status: Married    Spouse name: Micheal  . Number of children: 2  . Years of education: 76  . Highest education level: High school graduate  Occupational History  . Occupation: Nature conservation officer  Tobacco Use  . Smoking status: Never Smoker  . Smokeless tobacco: Never Used  Vaping Use  . Vaping Use: Never used  Substance and Sexual Activity  . Alcohol use: Never  . Drug use: Never  . Sexual activity: Yes    Birth control/protection: None  Other Topics Concern  . Not on file  Social History Narrative  . Not on file   Social Determinants of Health   Financial Resource Strain: Low Risk   . Difficulty of Paying Living Expenses: Not hard at all  Food Insecurity: No Food Insecurity  . Worried About Programme researcher, broadcasting/film/video in the Last Year: Never true  . Ran Out of Food in the Last  Year: Never true  Transportation Needs: No Transportation Needs  . Lack of Transportation (Medical): No  . Lack of Transportation (Non-Medical): No  Physical Activity: Insufficiently Active  . Days of Exercise per Week: 2 days  . Minutes of Exercise per Session: 30 min  Stress: No Stress Concern Present  . Feeling of Stress : Only a little  Social Connections: Moderately Integrated  . Frequency of Communication with Friends and Family: More than three times a week  . Frequency of Social Gatherings with Friends and Family: Once a week  . Attends Religious Services: More than 4 times per year  . Active Member of Clubs or Organizations: No  . Attends Banker Meetings: Never  . Marital Status: Married  Catering manager Violence: Not At Risk  . Fear of Current or Ex-Partner: No  . Emotionally Abused: No  . Physically Abused: No  . Sexually Abused: No    Allergies:  Allergies  Allergen Reactions  . Latex Rash    Medications: Prior to Admission medications   Medication Sig Start Date End Date Taking? Authorizing Provider  acetaminophen (TYLENOL) 325 MG tablet Take 650 mg by mouth every 6 (six) hours as needed (pain.).    [provider]  metoCLOPramide (REGLAN) 10 MG tablet Take 1 tablet (10 mg total) by mouth 3 (three) times daily  with meals. 11/18/19 11/17/20  Natale Milch, MD  metoprolol tartrate (LOPRESSOR) 50 MG tablet Take 1 tablet (50 mg total) by mouth as needed (palpitations). 12/25/19 12/24/20  Graciella Freer, PA-C    Physical Exam Vitals:  Vitals:   09/05/19 1555  BP: 115/72  Pulse: 80  Resp: 16  SpO2: 98%    OBGyn Exam  Assessment: 35 y.o. G1P1001 presenting for 6 week postpartum visit  Plan: Problem List Items Addressed This Visit   None      1) Contraception-  discussed  2) Patient underwent screening for postpartum depression with no concerns noted.  - Follow up for routine annual exam  Adelene Idler MD,  [facility], [facility] 05/06/2020 9:23 AM

## 2020-07-13 ENCOUNTER — Ambulatory Visit: Payer: 59 | Admitting: Internal Medicine

## 2020-12-21 IMAGING — US US MFM OB FOLLOW-UP
1 series · 12 of 28 positions shown · non-contrast
Comparison: none

PATIENT INFO:

PERFORMED BY:
SERVICE(S) PROVIDED:
 ----------------------------------------------------------------------
INDICATIONS:
  25 weeks gestation of pregnancy
FETAL EVALUATION:
 Num Of Fetuses:         1
 Fetal Heart Rate(bpm):  140
 Cardiac Activity:       Present
 Presentation:           Cephalic
 Placenta:               Fundal, No previa
 AFI Sum(cm)     %Tile       Largest Pocket(cm)
 16.52           61
 RUQ(cm)       RLQ(cm)       LUQ(cm)        LLQ(cm)
BIOMETRY:
 BPD:      65.9  mm     G. Age:  26w 4d         71  %    CI:        71.83   %    70 - 86
                                                         FL/HC:      19.4   %    18.6 -
 HC:      247.5  mm     G. Age:  26w 6d         67  %    HC/AC:      1.12        1.04 -
 AC:      220.6  mm     G. Age:  26w 3d         66  %    FL/BPD:     72.8   %    71 - 87
 FL:         48  mm     G. Age:  26w 1d         48  %    FL/AC:      21.8   %    20 - 24
 HUM:        44  mm     G. Age:  26w 1d         56  %
 Est. FW:     930  gm      2 lb 1 oz     64  %
GESTATIONAL AGE:
 LMP:           25w 5d        Date:  11/02/18                 EDD:   08/09/19
 Clinical EDD:  25w 5d                                        EDD:   08/09/19
 U/S Today:     26w 4d                                        EDD:   08/03/19
 Best:          25w 5d     Det. By:  LMP  (11/02/18)          EDD:   08/09/19
ANATOMY:
 Cavum:                 Visualized             Stomach:                Seen
                        previously
 Ventricles:            Normal appearance      Abdominal Wall:         Visualized
                                                                       previously
 Cerebellum:            Visualized             Cord Vessels:           3 vessels,
                        previously                                     visualized previously
 Posterior Fossa:       Visualized             Kidneys:                Normal appearance
 Face:                  Orbits visualized      Bladder:                Seen
 Lips:                  Visualized             Spine:                  Visualized
                        previously                                     previously
 Heart:                 4-Chamber view         Upper Extremities:      Visualized
                        appears normal                                 previously
 RVOT:                  Normal appearance      Lower Extremities:      Visualized
 LVOT:                  Normal appearance
 Other:  Right club foot once again vis.  Left appears WNL
CERVIX UTERUS ADNEXA:
 Cervix
 Length:           3.04  cm.

[Series 1: us mfm ob follow-up · 0.23mm/px · 12 of 55 slices shown]
[im 3/55]
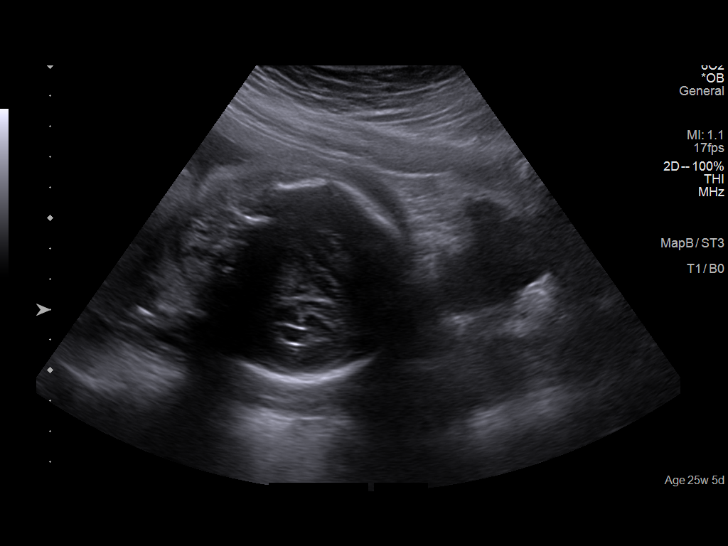
[im 7/55]
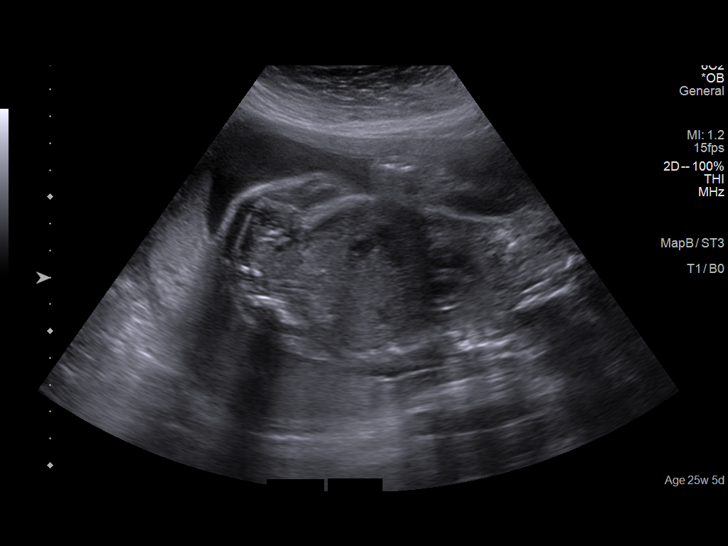
[im 11/55]
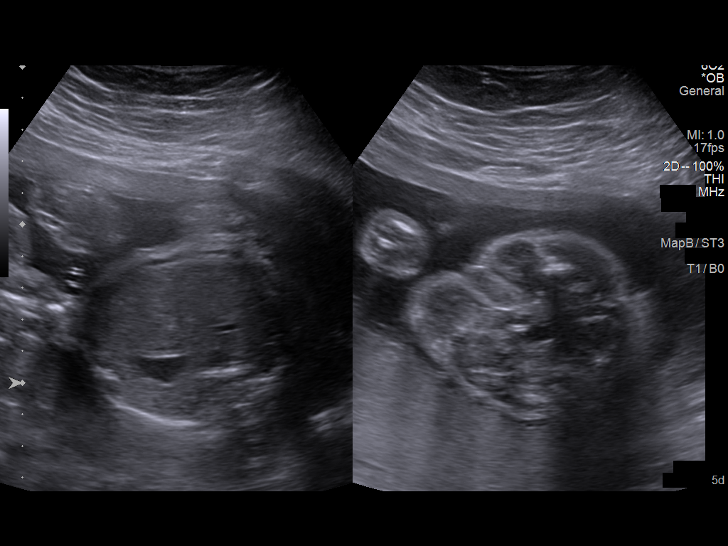
[im 17/55]
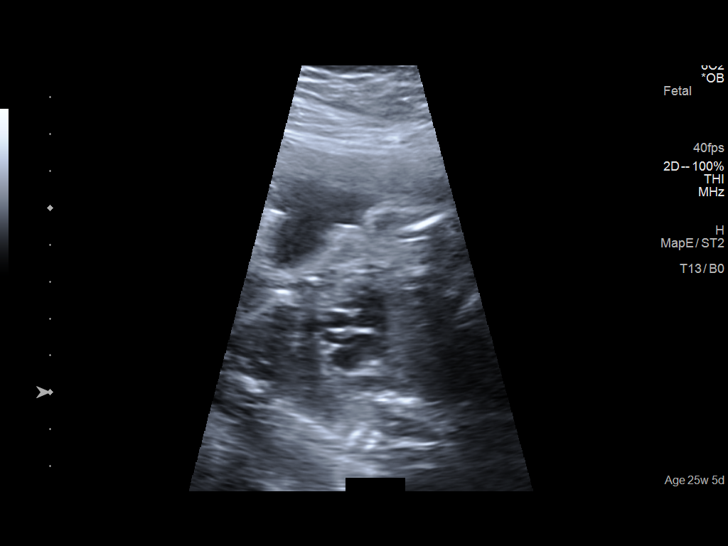
[im 21/55]
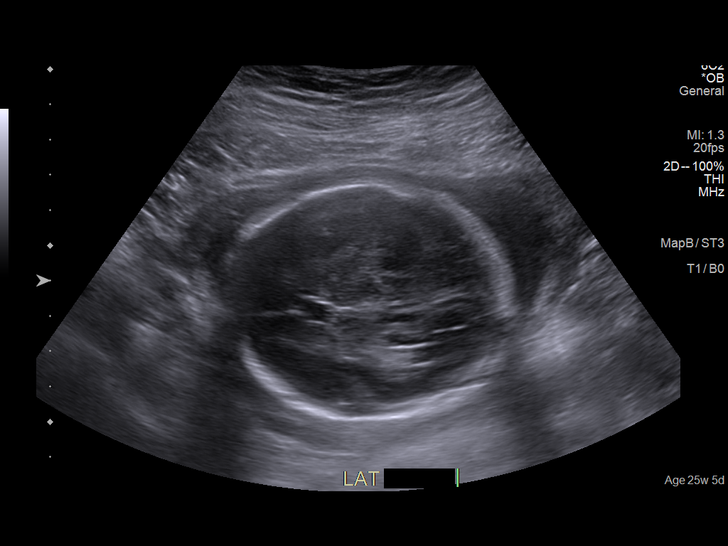
[im 25/55]
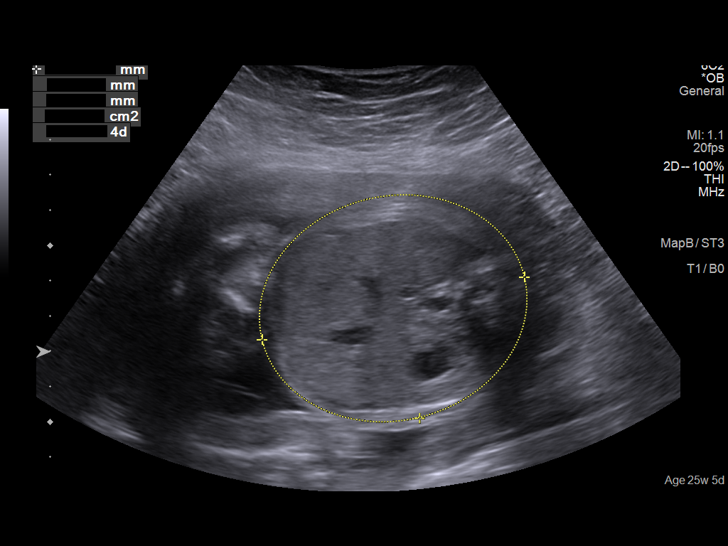
[im 31/55]
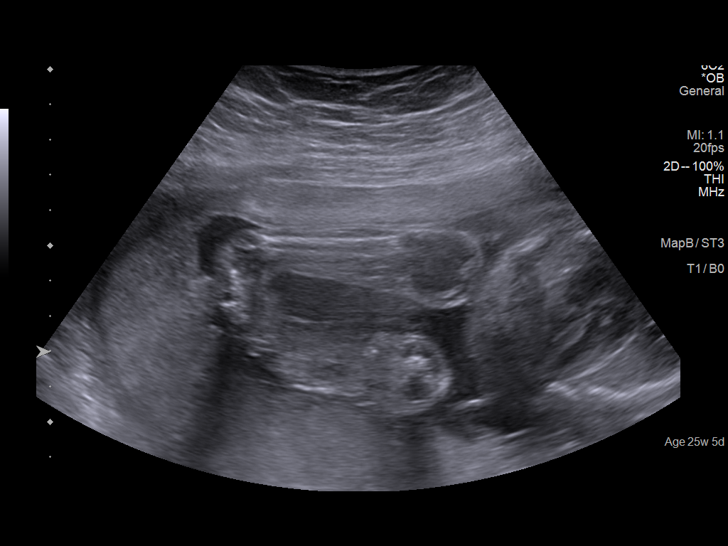
[im 35/55]
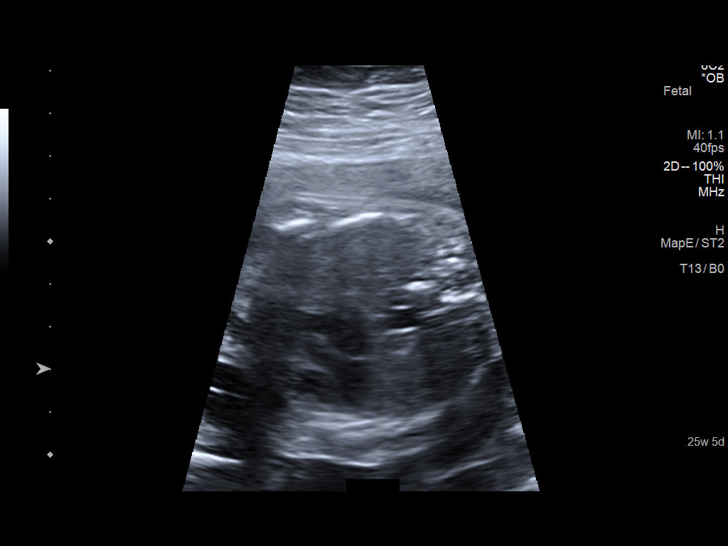
[im 39/55]
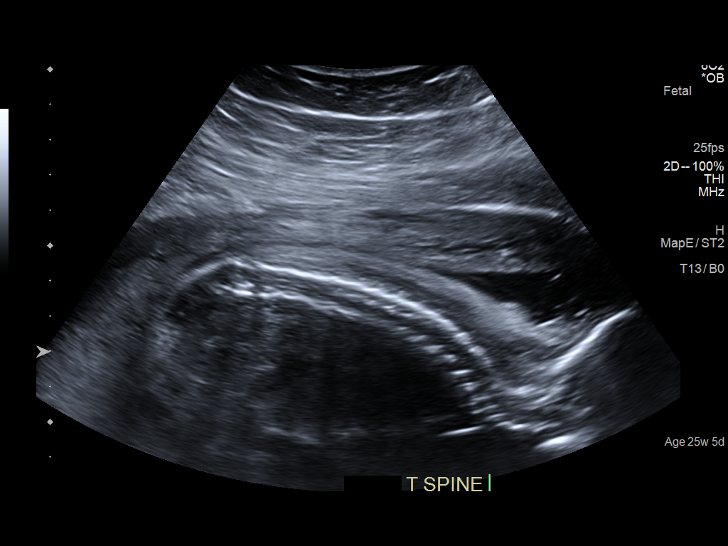
[im 45/55]
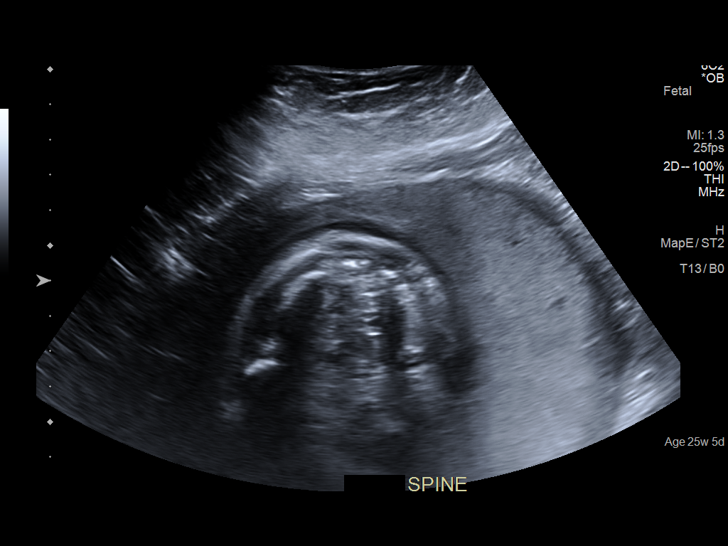
[im 49/55]
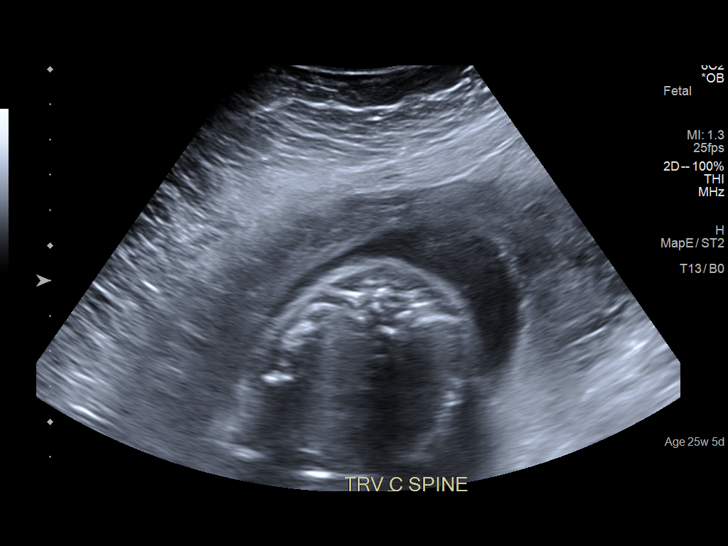
[im 53/55]
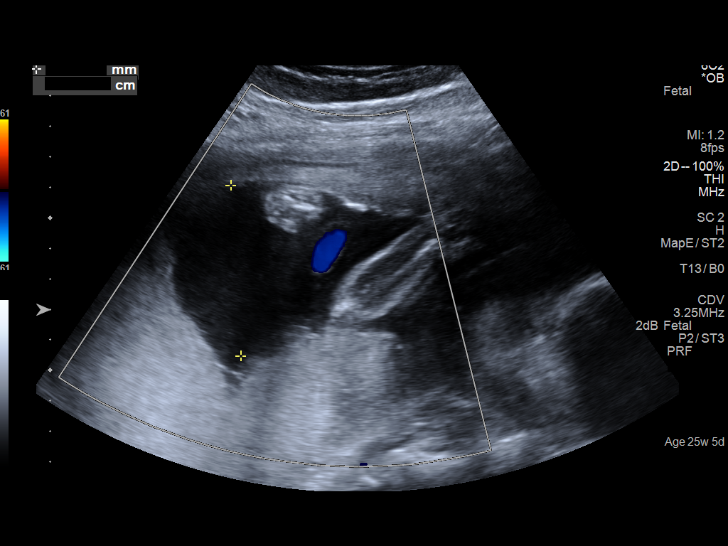

[12 of 28 positions shown; findings below may reference images not displayed]

IMPRESSION: Dear Dr. KIM LUND,
 ,

 Thank you for referring your patient for a fetal growth
 evaluation due to club foot , elevated BMI and metoprolol
 exposure for maternal SVT.
 Patient reports no symptomatic SVT recently.

 There is a singleton gestation with normal amniotic fluid
 volume.
 Pt is at 71w1d based on LMP of 11/02/18 and u/s at [facility]
 on 03/24/19 at 20w 2d.

 There is an apparently isolated right "club foot " again noted -
 Talipes equinovarus .

 The fetal biometry correlates with established dating.
 Adequate interval growth noted.
 The estimated fetal weight is at the  64th
    percentile. TIGER.

 I ordered a follow up growth scan in  6weeks due to
 metoprolol exposure

 Thank you for allowing us to participate in your patient's care.
 assistance

## 2021-02-15 IMAGING — US US MFM OB FOLLOW-UP
1 series · 12 of 28 positions shown · non-contrast
Comparison: none

[Series 1: us mfm ob follow-up · 0.25mm/px · 12 of 41 slices shown]
[im 2/41]
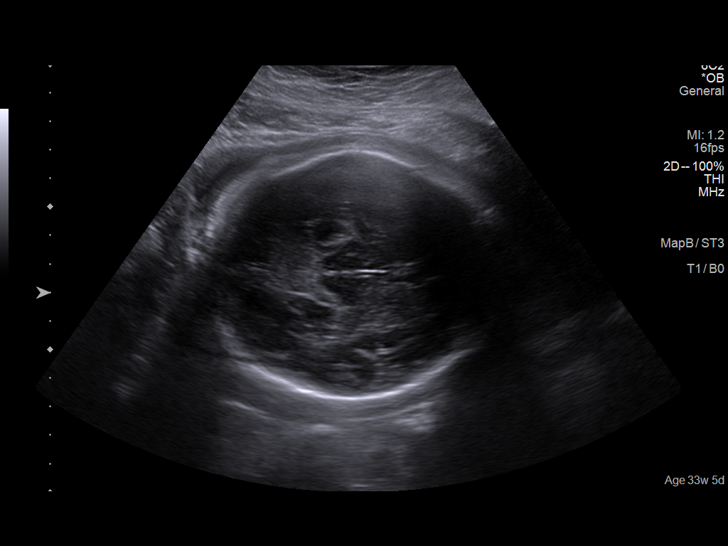
[im 5/41]
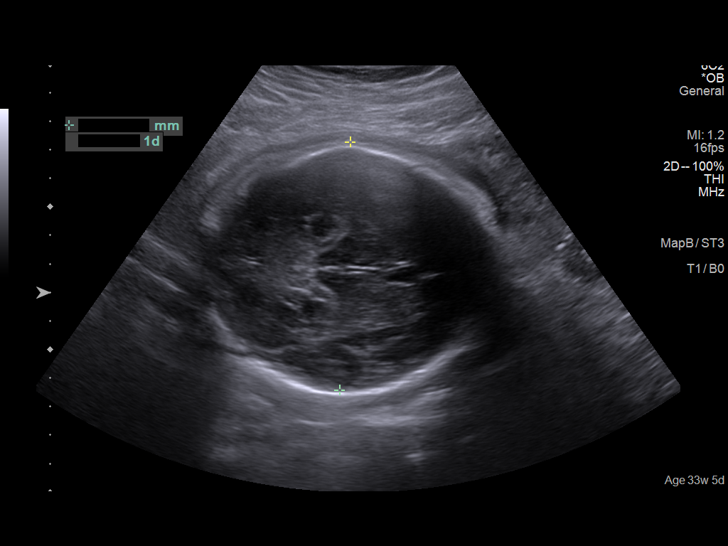
[im 8/41]
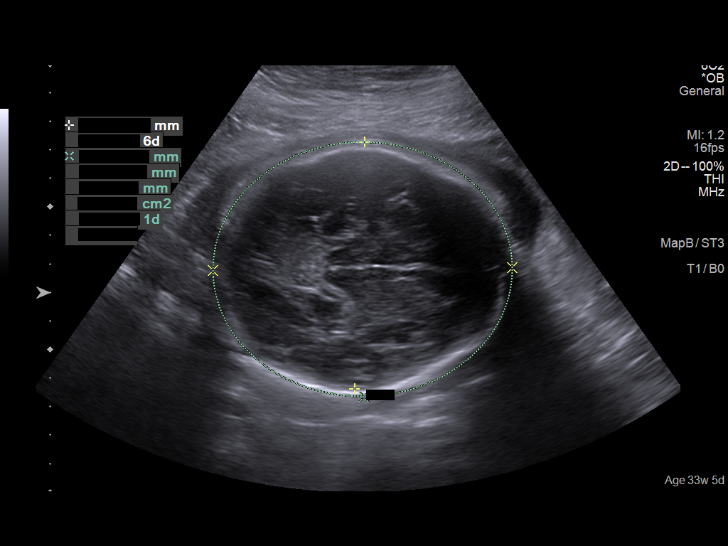
[im 12/41]
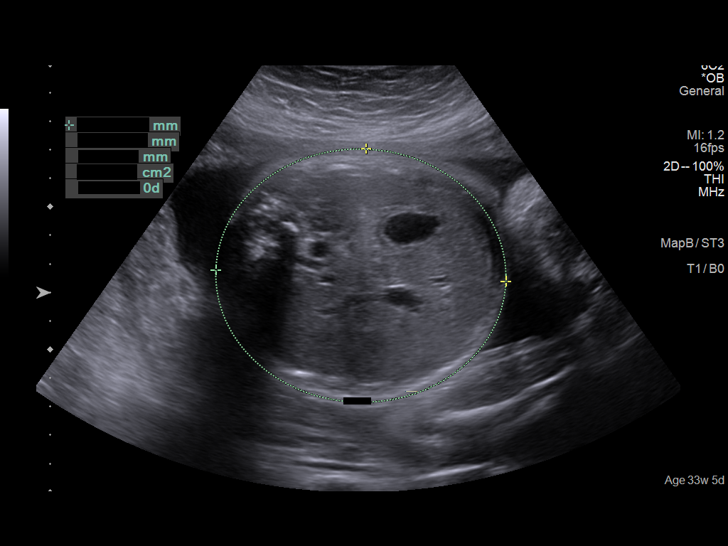
[im 15/41]
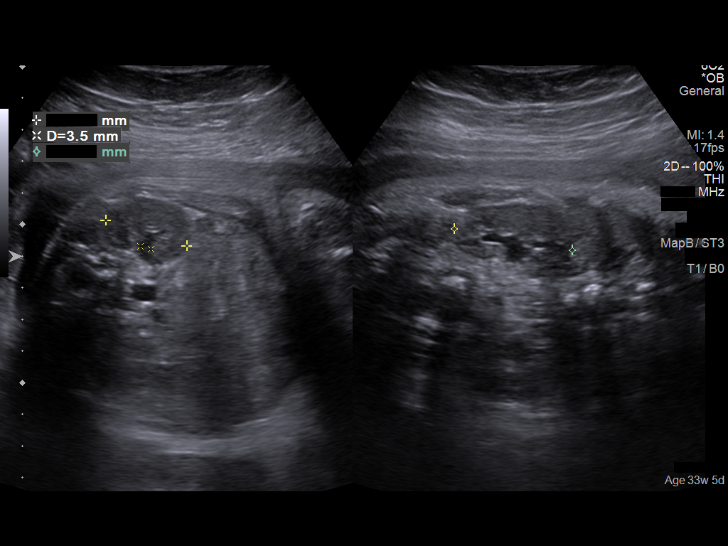
[im 18/41]
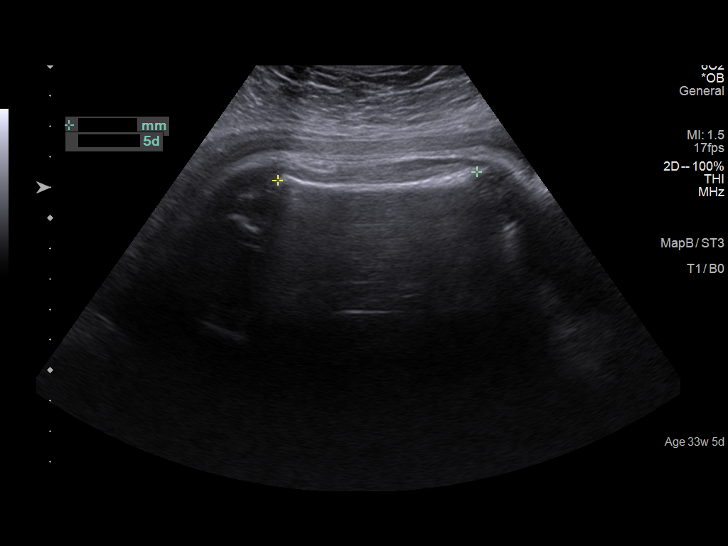
[im 23/41]
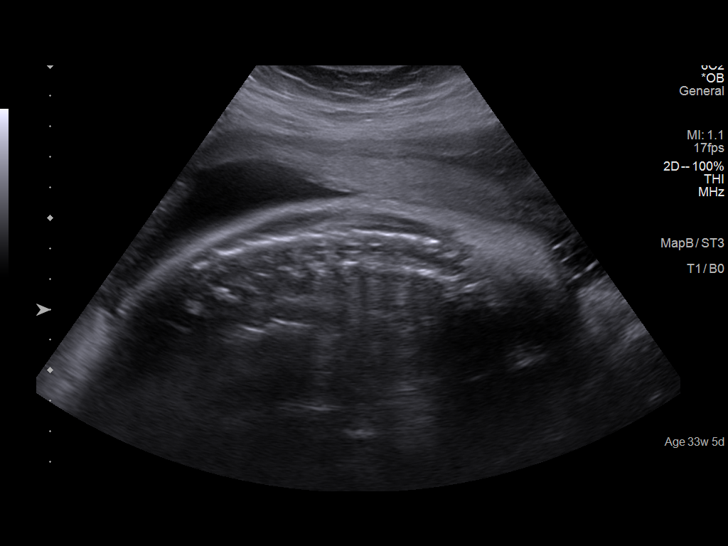
[im 26/41]
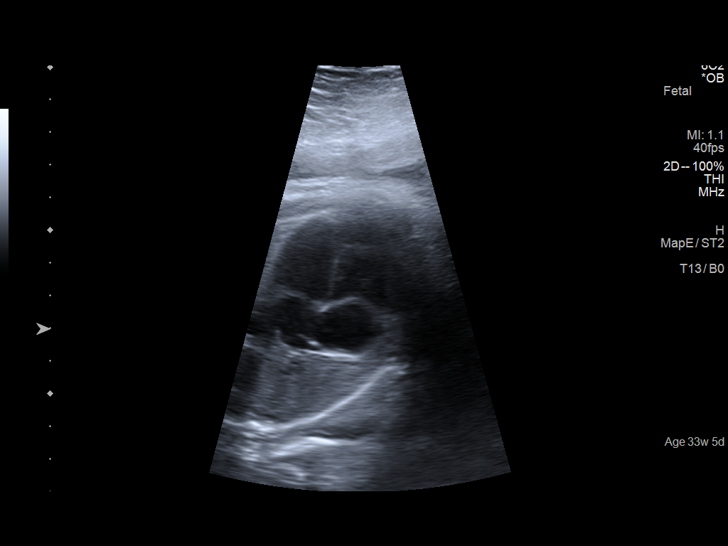
[im 29/41]
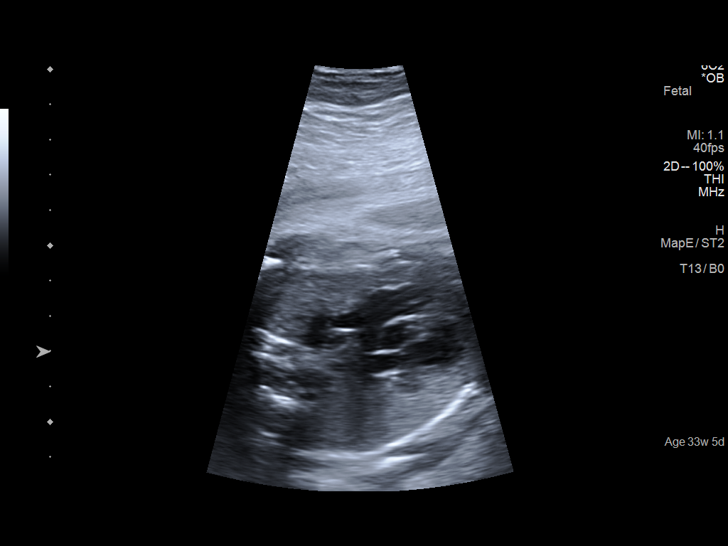
[im 33/41]
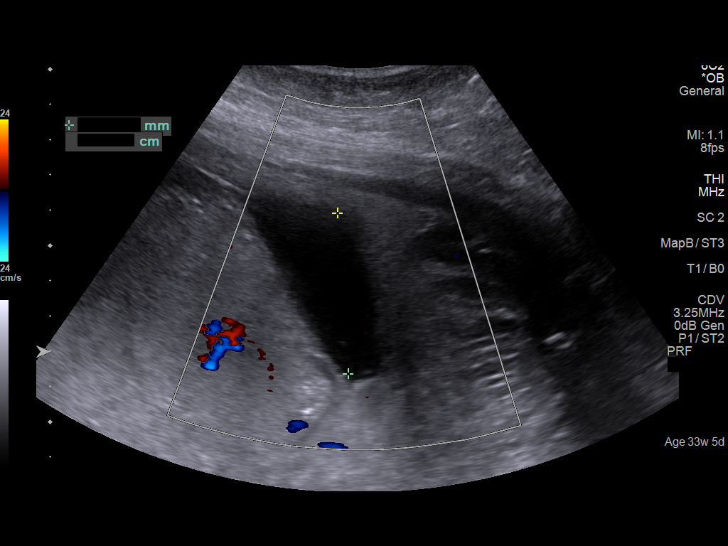
[im 36/41]
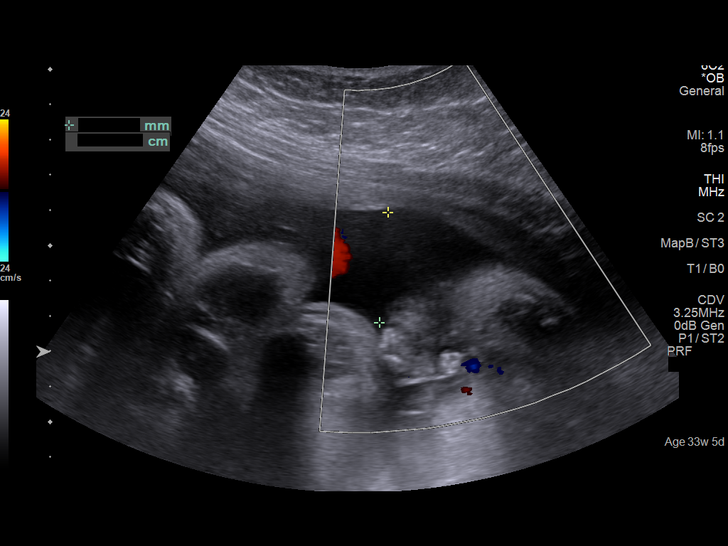
[im 39/41]
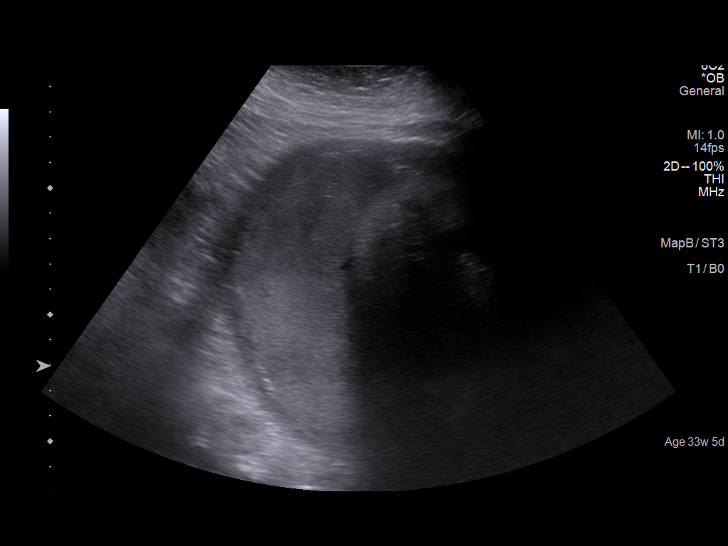

[12 of 28 positions shown; findings below may reference images not displayed]

PATIENT INFO:

PERFORMED BY:

                   Sonographer
SERVICE(S) PROVIDED:

 ----------------------------------------------------------------------
INDICATIONS:

  33 weeks gestation of pregnancy
 ----------------------------------------------------------------------
FETAL EVALUATION:

 Num Of Fetuses:         1
 Fetal Heart Rate(bpm):  136
 Cardiac Activity:       Present
 Presentation:           Cephalic
 Placenta:               Posterior, fundal

 Amniotic Fluid
 AFI FV:      Within normal limits

 AFI Sum(cm)     %Tile       Largest Pocket(cm)
 11.89           33

 RUQ(cm)       RLQ(cm)       LUQ(cm)        LLQ(cm)

BIOMETRY:

 BPD:      86.8  mm     G. Age:  35w 0d         82  %    CI:        80.15   %    70 - 86
                                                         FL/HC:      21.4   %    19.4 -
 HC:      306.3  mm     G. Age:  34w 1d         24  %    HC/AC:      1.03        0.96 -
 AC:      296.4  mm     G. Age:  33w 4d         50  %    FL/BPD:     75.5   %    71 - 87
 FL:       65.5  mm     G. Age:  33w 5d         41  %    FL/AC:      22.1   %    20 - 24
 HUM:      57.7  mm     G. Age:  33w 3d         53  %

 Est. FW:    9943  gm      5 lb 1 oz     47  %
GESTATIONAL AGE:

 LMP:           33w 5d        Date:  11/02/18                 EDD:   08/09/19
 Clinical EDD:  33w 5d                                        EDD:   08/09/19
 U/S Today:     34w 1d                                        EDD:   08/06/19
 Best:          33w 5d     Det. By:  LMP  (11/02/18)          EDD:   08/09/19
ANATOMY:
 Cranium:               Within Normal Limits   Diaphragm:              Normal appearance
 Cavum:                 Normal appearance      Stomach:                Seen
 Ventricles:            Normal appearance      Abdomen:                Normal appearance
 Thoracic:              Normal appearance      Kidneys:                Within Normal Limits
 Heart:                 4-Chamber view         Bladder:                Seen
                        appears normal
 RVOT:                  Normal appearance      Spine:                  Appears WNL
 LVOT:                  Normal appearance

 Other:  Right club foot again visualized
CERVIX UTERUS ADNEXA:

 Cervix
 Appears closed, suboptimal
COMMENTS:

 Dear Dr. MEDA
 ,
 Thank you for referring your patient  for a fetal growth
 evaluation due to maternal metoprolol exposure (due to
 maternal SVT) and fetal isolated club foot -unilateral. She
 reports one episode of SVT that lasted approximately an hour
 and resolved with an additional dose of metoprolol.

 She is currently at 33 weeks  w 5d by LMP confirmed by
 earliest scan at [facility] on 03/24/2019 at 83w8d .

 There is a singleton gestation with normal amniotic fluid
 volume.

 The fetal biometry correlates with established dating.
 Adequate interval growth noted.
 The estimated fetal weight is at the 42nd   percentile. MICHIHIRO.
 Isolated right club foot again noted

 Recommend follow-up scan for fetal growth in 4 weeks

 She has made contact with a pediatric orthopedic surgeon at
 [REDACTED] and has plans for follow up after delivery.
 She was previously advised to avoid prolonged car travel
 after 34-36 weeks .

 Thank you for allowing us to participate in your patient's care.
 assistance.
                  Ferienhaus

## 2022-02-22 ENCOUNTER — Telehealth: Payer: Self-pay | Admitting: Internal Medicine

## 2022-02-22 NOTE — Telephone Encounter (Signed)
The below information is from Dr Tanna Furry 01/21/2020 office visit note.    Assess/Plan: 1. SVT - she s/p attempted unsuccessful ablation of a concealed left posteroseptal epicardial AP for which we could terminate the tachycardia/transiently eliminate retrograde AP conduction but which always recurred. She is stable and will continue her beta blocker. We discussed epicardial access which I do not recommend as well as other AA drugs. For now she will continue her beta blocker. If she decides to become pregnant she will hold her beta blocker during the first trimester of pregnancy.   Will sent to Dr Lovena Le for his review.    Spoke with pt and advised Dr Lovena Le is not in the office today but will send to him for review.  Pt reports she has not had to take any beta blocker at all for her SVT. She is aware she will be called back with any further instructions.

## 2022-02-22 NOTE — Telephone Encounter (Signed)
Patient called and said that she needs to get approval from Dr. Lovena Le for pregnancy. Michela Pitcher that she had an ablation done awhile ago. Please call back to discuss

## 2022-10-26 ENCOUNTER — Other Ambulatory Visit: Payer: Self-pay | Admitting: Student

## 2022-10-26 DIAGNOSIS — N6311 Unspecified lump in the right breast, upper outer quadrant: Secondary | ICD-10-CM

## 2022-10-27 ENCOUNTER — Ambulatory Visit (INDEPENDENT_AMBULATORY_CARE_PROVIDER_SITE_OTHER): Payer: 59 | Admitting: Obstetrics & Gynecology

## 2022-10-27 ENCOUNTER — Encounter: Payer: Self-pay | Admitting: Obstetrics & Gynecology

## 2022-10-27 ENCOUNTER — Other Ambulatory Visit (HOSPITAL_COMMUNITY)
Admission: RE | Admit: 2022-10-27 | Discharge: 2022-10-27 | Disposition: A | Discharge status: Home / Self Care | Payer: 59 | Source: Ambulatory Visit | Attending: Obstetrics & Gynecology | Admitting: Obstetrics & Gynecology

## 2022-10-27 VITALS — BP 108/74 | HR 86 | Ht 62.0 in | Wt 157.0 lb

## 2022-10-27 DIAGNOSIS — N913 Primary oligomenorrhea: Secondary | ICD-10-CM

## 2022-10-27 DIAGNOSIS — Z8742 Personal history of other diseases of the female genital tract: Secondary | ICD-10-CM

## 2022-10-27 DIAGNOSIS — Z124 Encounter for screening for malignant neoplasm of cervix: Secondary | ICD-10-CM | POA: Insufficient documentation

## 2022-10-27 DIAGNOSIS — Z01419 Encounter for gynecological examination (general) (routine) without abnormal findings: Secondary | ICD-10-CM | POA: Diagnosis present

## 2022-10-27 DIAGNOSIS — N9089 Other specified noninflammatory disorders of vulva and perineum: Secondary | ICD-10-CM

## 2022-10-27 MED ORDER — MEDROXYPROGESTERONE ACETATE 10 MG PO TABS: 10.0000 mg | ORAL_TABLET | Freq: Every day | ORAL | 2 refills | Status: AC

## 2022-10-27 NOTE — Progress Notes (Signed)
GYNECOLOGY ANNUAL PHYSICAL EXAM PROGRESS NOTE  Subjective:    Kiara Reed is a 37 y.o. married G1P1001 who presents for an annual exam. She reports long standing oligomenorrhea. She had menarche around 37 years old. She has about 1 period per year. She was treated with letrozole x 1 course to conceive her child. She has never used contraception since her marriage 10 years ago. The patient is sexually active. The patient participates in regular exercise: no. Has the patient ever been transfused or tattooed?: yes. The patient reports that there is not domestic violence in her life.   She also noted a right breast lump about 3 weeks ago. She saw her primary care provider recently about this and has a mammogram and ultrasound scheduled for this Monday. She denies a FH of breast cancer. Her mom died at 69 years old due to vulvar cancer.  She gives a h/o an ER visit about 12 years ago for vulvar blisters. She reports that she had a swab at that time that was negative for HSV 2. She has a prescription for valtrex for her oral fever blisters.  Menstrual History: Menarche age: 66 Patient's last menstrual period was 02/20/2022. Period Pattern: (!) Irregular   Gynecologic History:  Contraception: none History of STI's:  Last Pap: 2020. Results were: normal.          OB History  Gravida Para Term Preterm AB Living  1 1 1  0 0 1  SAB IAB Ectopic Multiple Live Births  0 0 0 0 1    # Outcome Date GA Lbr Len/2nd Weight Sex Type Anes PTL Lv  1 Term 08/13/19 [redacted]w[redacted]d / 03:39 7 lb 11.8 oz (3.51 kg) M Vag-Vacuum EPI  LIV     Name: Kiara Reed     Apgar1: 7  Apgar5: 9    Past Medical History:  Diagnosis Date   SOB (shortness of breath)    SVT (supraventricular tachycardia)     Past Surgical History:  Procedure Laterality Date   SVT ABLATION N/A 12/24/2019   Procedure: SVT ABLATION;  Surgeon: Marinus Maw, MD;  Location: [facility];  Service: Cardiovascular;  Laterality:  N/A;   TONSILLECTOMY AND ADENOIDECTOMY      Family History  Problem Relation Age of Onset   Bipolar disorder Mother    Cancer - Other Mother    Vaginal cancer Mother 25   Leukemia Paternal Aunt    COPD Maternal Grandmother     Social History   Socioeconomic History   Marital status: Married    Spouse name: Kiara Reed   Number of children: 2   Years of education: 12   Highest education level: High school graduate  Occupational History   Occupation: Nature conservation officer  Tobacco Use   Smoking status: Never   Smokeless tobacco: Never  Vaping Use   Vaping status: Never Used  Substance and Sexual Activity   Alcohol use: Never   Drug use: Never   Sexual activity: Yes    Birth control/protection: None  Other Topics Concern   Not on file  Social History Narrative   Not on file   Social Determinants of Health   Financial Resource Strain: Low Risk  (12/24/2019)   Overall Financial Resource Strain (CARDIA)    Difficulty of Paying Living Expenses: Not hard at all  Food Insecurity: No Food Insecurity (12/24/2019)   Hunger Vital Sign    Worried About Running Out of Food in the Last Year: Never true  Ran Out of Food in the Last Year: Never true  Transportation Needs: No Transportation Needs (12/24/2019)   PRAPARE - Administrator, Civil Service (Medical): No    Lack of Transportation (Non-Medical): No  Physical Activity: Insufficiently Active (12/24/2019)   Exercise Vital Sign    Days of Exercise per Week: 2 days    Minutes of Exercise per Session: 30 min  Stress: No Stress Concern Present (12/24/2019)   [employer] of Occupational Health - Occupational Stress Questionnaire    Feeling of Stress : Only a little  Social Connections: Moderately Integrated (12/24/2019)   Social Connection and Isolation Panel [NHANES]    Frequency of Communication with Friends and Family: More than three times a week    Frequency of Social Gatherings with Friends and Family: Once a  week    Attends Religious Services: More than 4 times per year    Active Member of [employer] or Organizations: No    Attends Banker Meetings: Never    Marital Status: Married  Catering manager Violence: Not At Risk (12/24/2019)   Humiliation, Afraid, Rape, and Kick questionnaire    Fear of Current or Ex-Partner: No    Emotionally Abused: No    Physically Abused: No    Sexually Abused: No    Current Outpatient Medications on File Prior to Visit  Medication Sig Dispense Refill   metoprolol tartrate (LOPRESSOR) 50 MG tablet Take by mouth.     acetaminophen (TYLENOL) 325 MG tablet Take 650 mg by mouth every 6 (six) hours as needed (pain.). (Patient not taking: Reported on 10/27/2022)     metoCLOPramide (REGLAN) 10 MG tablet Take 1 tablet (10 mg total) by mouth 3 (three) times daily with meals. 90 tablet 1   metoprolol tartrate (LOPRESSOR) 50 MG tablet Take 1 tablet (50 mg total) by mouth as needed (palpitations). 30 tablet 2   No current facility-administered medications on file prior to visit.    Allergies  Allergen Reactions   Latex Rash     Review of Systems Constitutional: negative for chills, fatigue, fevers and sweats Eyes: negative for irritation, redness and visual disturbance Ears, nose, mouth, throat, and face: negative for hearing loss, nasal congestion, snoring and tinnitus Respiratory: negative for asthma, cough, sputum Cardiovascular: negative for chest pain, dyspnea, exertional chest pressure/discomfort, irregular heart beat, palpitations and syncope Gastrointestinal: negative for abdominal pain, change in bowel habits, nausea and vomiting Genitourinary: negative for abnormal menstrual periods, genital lesions, sexual problems and vaginal discharge, dysuria and urinary incontinence Integument/breast: negative for breast lump, breast tenderness and nipple discharge Hematologic/lymphatic: negative for bleeding and easy bruising Musculoskeletal:negative for back  pain and muscle weakness Neurological: negative for dizziness, headaches, vertigo and weakness Endocrine: negative for diabetic symptoms including polydipsia, polyuria and skin dryness Allergic/Immunologic: negative for hay fever and urticaria      Objective:  Blood pressure 108/74, pulse 86, height 5\' 2"  (1.575 m), weight 157 lb (71.2 kg), last menstrual period 02/20/2022, currently breastfeeding. Body mass index is 28.72 kg/m.    General Appearance:    Alert, cooperative, no distress, appears stated age  Head:    Normocephalic, without obvious abnormality, atraumatic  Eyes:    PERRL, conjunctiva/corneas clear, EOM's intact, both eyes  Ears:    Normal external ear canals, both ears  Nose:   Nares normal, septum midline, mucosa normal, no drainage or sinus tenderness  Throat:   Lips, mucosa, and tongue normal; teeth and gums normal  Neck:   Supple,  symmetrical, trachea midline, no adenopathy; thyroid: no enlargement/tenderness/nodules; no carotid bruit or JVD  Back:     Symmetric, no curvature, ROM normal, no CVA tenderness  Lungs:     Clear to auscultation bilaterally, respirations unlabored  Chest Wall:    No tenderness or deformity   Heart:    Regular rate and rhythm, S1 and S2 normal, no murmur, rub or gallop  Breast Exam:    No tenderness, masses, or nipple abnormality  Abdomen:     Soft, non-tender, bowel sounds active all four quadrants, no masses, no organomegaly.    Genitalia:    Pelvic:external genitalia normal with the exception of a .8 x  cm ulceration at her posterior vaginal introitus, vagina without lesions, discharge, or tenderness,  Cervix normal in appearance, no cervical motion tenderness, no adnexal masses or tenderness.  Uterus normal size, shape, mobile, regular contours, nontender.     Extremities:   Extremities normal, atraumatic, no cyanosis or edema  Pulses:   2+ and symmetric all extremities  Skin:   Skin color, texture, turgor normal, no rashes or lesions   Lymph nodes:   Cervical, supraclavicular, and axillary nodes normal  Neurologic:   CNII-XII intact, normal strength, sensation and reflexes throughout   .  Labs:  Lab Results  Component Value Date   WBC 7.7 12/24/2019   HGB 12.0 12/24/2019   HCT 38.4 12/24/2019   MCV 81.9 12/24/2019   PLT 285 12/24/2019    Lab Results  Component Value Date   CREATININE 0.68 12/24/2019   BUN 18 12/24/2019   NA 140 12/24/2019   K 4.1 12/24/2019   CL 108 12/24/2019   CO2 24 12/24/2019    Lab Results  Component Value Date   ALT 21 01/01/2019   AST 22 01/01/2019   ALKPHOS 54 01/01/2019   BILITOT 0.1 (L) 01/01/2019    Lab Results  Component Value Date   TSH 1.154 01/01/2019     Assessment:   1. Well woman exam with routine gynecological exam   2. Screening for cervical cancer   3. Primary oligomenorrhea   4. History of infertility   5. Vulvar lesion      Plan:  Check pap Rec SVE monthly Cyclic provera to decrease risk of future uterine cancer She is interested in conception and is taking folic acid daily. She will come in for appt when she wants to restart Letrozole. With regard to the vulvar ulceration, she does want HSV 2 IgG tested so that she will know if she needs valtrex during the last portion of her future pregnancy to help prevent HSV neonatal infection. She will have her breast imaging done Monday.   Allie Bossier, MD [facility]

## 2022-10-28 LAB — HSV 1 AND 2 AB, IGG
HSV 1 Glycoprotein G Ab, IgG: 39.2 {index} — ABNORMAL HIGH (ref 0.00–0.90)
HSV 2 IgG, Type Spec: <0.91 {index} (ref 0.00–0.90)

## 2022-10-30 ENCOUNTER — Encounter: Payer: Self-pay | Admitting: Obstetrics & Gynecology

## 2022-10-30 ENCOUNTER — Ambulatory Visit
Admission: RE | Admit: 2022-10-30 | Discharge: 2022-10-30 | Disposition: A | Discharge status: Home / Self Care | Payer: 59 | Source: Ambulatory Visit | Attending: Student | Admitting: Student

## 2022-10-30 DIAGNOSIS — N6311 Unspecified lump in the right breast, upper outer quadrant: Secondary | ICD-10-CM

## 2022-11-01 ENCOUNTER — Other Ambulatory Visit: Payer: Self-pay | Admitting: Student

## 2022-11-01 DIAGNOSIS — R928 Other abnormal and inconclusive findings on diagnostic imaging of breast: Secondary | ICD-10-CM

## 2022-11-01 DIAGNOSIS — N63 Unspecified lump in unspecified breast: Secondary | ICD-10-CM

## 2022-11-01 LAB — CYTOLOGY - PAP
Comment: NEGATIVE
Diagnosis: NEGATIVE
High risk HPV: NEGATIVE

## 2022-11-07 ENCOUNTER — Ambulatory Visit
Admission: RE | Admit: 2022-11-07 | Discharge: 2022-11-07 | Disposition: A | Discharge status: Home / Self Care | Payer: 59 | Source: Ambulatory Visit | Attending: Student | Admitting: Student

## 2022-11-07 DIAGNOSIS — R928 Other abnormal and inconclusive findings on diagnostic imaging of breast: Secondary | ICD-10-CM

## 2022-11-07 DIAGNOSIS — N63 Unspecified lump in unspecified breast: Secondary | ICD-10-CM

## 2022-11-07 HISTORY — PX: BREAST BIOPSY: SHX20

## 2022-11-07 MED ORDER — LIDOCAINE-EPINEPHRINE 1 %-1:100000 IJ SOLN
8.0000 mL | Freq: Once | INTRAMUSCULAR | Status: AC
  Administered 2022-11-07: 8 mL
  Filled 2022-11-07: qty 8

## 2022-11-07 MED ORDER — LIDOCAINE 1 % OPTIME INJ - NO CHARGE
2.0000 mL | Freq: Once | INTRAMUSCULAR | Status: AC
  Administered 2022-11-07: 2 mL via INTRADERMAL
  Filled 2022-11-07: qty 2

## 2023-08-13 ENCOUNTER — Other Ambulatory Visit: Payer: Self-pay

## 2023-08-13 NOTE — Telephone Encounter (Signed)
 TRIAGE VOICEMAIL: Patient states she has had a yearly appointment with Dr. Starla and had discussed fertility. Patient advised Dr. Starla stated she would be willing to send fertility treatment for three months if patient decided she wants to conceive again. Patient requesting rx.

## 2023-08-13 NOTE — Telephone Encounter (Signed)
 Spoke with patient. Advised last note states: She will come in for appt when she wants to restart Letrozole. Advised Dr. Starla has left for the day and will not be back in the office til 7/28. Contacted Dr. Starla via phone. She agreed to send meds. Patient aware. Pharmacy changed to [facility] per patient request.

## 2023-08-15 ENCOUNTER — Other Ambulatory Visit: Payer: Self-pay | Admitting: Obstetrics & Gynecology

## 2023-08-15 MED ORDER — LETROZOLE 2.5 MG PO TABS: ORAL_TABLET | ORAL | 2 refills | Status: AC

## 2023-08-15 NOTE — Progress Notes (Signed)
 Letrozole prescribed for ovulation induction.

## 2023-12-03 NOTE — Telephone Encounter (Signed)
 Pharmacy notified refills denied. Previous encounter states Dr. Starla agreed to 3 months of fertility treatment. No further refills. Patient needs referral for fertility.

## 2024-01-23 ENCOUNTER — Ambulatory Visit

## 2024-01-23 VITALS — BP 108/69 | HR 90 | Ht 62.0 in | Wt 176.0 lb

## 2024-01-23 DIAGNOSIS — Z3201 Encounter for pregnancy test, result positive: Secondary | ICD-10-CM

## 2024-01-23 DIAGNOSIS — N912 Amenorrhea, unspecified: Secondary | ICD-10-CM

## 2024-01-23 DIAGNOSIS — Z3687 Encounter for antenatal screening for uncertain dates: Secondary | ICD-10-CM

## 2024-01-23 LAB — POCT URINE PREGNANCY: Preg Test, Ur: POSITIVE — AB

## 2024-01-23 NOTE — Patient Instructions (Signed)
 First Trimester of Pregnancy  The first trimester of pregnancy starts on the first day of your last monthly period until the end of week 13. This is months 1 through 3 of pregnancy. A week after a sperm fertilizes an egg, the egg will implant into the wall of the uterus and begin to develop into a baby. Body changes during your first trimester Your body goes through many changes during pregnancy. The changes usually return to normal after your baby is born. Physical changes Your breasts may grow larger and may hurt. The area around your nipples may get darker. Your periods will stop. Your hair and nails may grow faster. You may pee more often. Health changes You may tire easily. Your gums may bleed and may be sensitive when you brush and floss. You may not feel hungry. You may have heartburn. You may throw up or feel like you may throw up. You may want to eat some foods, but not others. You may have headaches. You may have trouble pooping (constipation). Other changes Your emotions may change from day to day. You may have more dreams. Follow these instructions at home: Medicines Talk to your health care provider if you're taking medicines. Ask if the medicines are safe to take during pregnancy. Your provider may change the medicines that you take. Do not take any medicines unless told to by your provider. Take a prenatal vitamin that has at least 600 micrograms (mcg) of folic acid. Do not use herbal medicines, illegal substances, or medicines that are not approved by your provider. Eating and drinking While you're pregnant your body needs extra food for your growing baby. Talk with your provider about what to eat while pregnant. Activity Most women are able to exercise during pregnancy. Exercises may need to change as your pregnancy goes on. Talk to your provider about your activities and exercise routines. Relieving pain and discomfort Wear a good, supportive bra if your breasts  hurt. Rest with your legs raised if you have leg cramps or low back pain. Safety Wear your seatbelt at all times when you're in a car. Talk to your provider if someone hits you, hurts you, or yells at you. Talk with your provider if you're feeling sad or have thoughts of hurting yourself. Lifestyle Certain things can be harmful while you're pregnant. Follow these rules: Do not use hot tubs, steam rooms, or saunas. Do not douche. Do not use tampons or scented pads. Do not drink alcohol,smoke, vape, or use products with nicotine or tobacco in them. If you need help quitting, talk with your provider. Avoid cat litter boxes and soil used by cats. These things carry germs that can cause harm to your pregnancy and your baby. General instructions Keep all follow-up visits. It helps you and your unborn baby stay as healthy as possible. Write down your questions. Take them to your visits. Your provider will: Talk with you about your overall health. Give you advice or refer you to specialists who can help with different needs, including: Prenatal education classes. Mental health and counseling. Foods and healthy eating. Ask for help if you need help with food. Call your dentist and ask to be seen. Brush your teeth with a soft toothbrush. Floss gently. Where to find more information American Pregnancy Association: americanpregnancy.org [employer]: acog.org Office on [employer] Health: [employer] Contact a health care provider if: You feel dizzy, faint, or have a fever. You vomit or have watery poop (diarrhea) for 2  days or more. You have abnormal discharge or bleeding from your vagina. You have pain when you pee or your pee smells bad. You have cramps, pain, or pressure in your belly area. Get help right away if: You have trouble breathing or chest pain. You have any kind of injury, such as from a fall or a car crash. These symptoms may be an  emergency. Get help right away. Call 911. Do not wait to see if the symptoms will go away. Do not drive yourself to the hospital. This information is not intended to replace advice given to you by your health care provider. Make sure you discuss any questions you have with your health care provider. Document Revised: 11/09/2022 Document Reviewed: 06/09/2022 Elsevier Patient Education  2024 [employer] Inc[employer]   Common Medications Safe in Pregnancy  Acne:      Constipation:  Benzoyl Peroxide     Colace  Clindamycin      Dulcolax Suppository  Topica Erythromycin     Fibercon  Salicylic Acid      Metamucil         Miralax AVOID:        Senakot   Accutane    Cough:  Retin-A       Cough Drops  Tetracycline      Phenergan w/ Codeine if Rx  Minocycline      Robitussin (Plain & DM)  Antibiotics:     Crabs/Lice:  Ceclor       RID  Cephalosporins    AVOID:  E-Mycins      Kwell  Keflex  Macrobid/Macrodantin   Diarrhea:  Penicillin      Kao-Pectate  Zithromax      Imodium AD         PUSH FLUIDS AVOID:       Cipro     Fever:  Tetracycline      Tylenol (Regular or Extra  Minocycline       Strength)  Levaquin      Extra Strength-Do not          Exceed 8 tabs/24 hrs Caffeine:        200mg /day (equiv. To 1 cup of coffee or  approx. 3 12 oz sodas)         Gas: Cold/Hayfever:       Gas-X  Benadryl      Mylicon  Claritin       Phazyme  **Claritin-D        Chlor-Trimeton    Headaches:  Dimetapp      ASA-Free Excedrin  Drixoral-Non-Drowsy     Cold Compress  Mucinex (Guaifenasin)     Tylenol (Regular or Extra  Sudafed/Sudafed-12 Hour     Strength)  **Sudafed PE Pseudoephedrine   Tylenol Cold & Sinus     Vicks Vapor Rub  Zyrtec  **AVOID if Problems With Blood Pressure         Heartburn: Avoid lying down for at least 1 hour after meals  Aciphex      Maalox     Rash:  Milk of Magnesia     Benadryl    Mylanta       1% Hydrocortisone Cream  Pepcid  Pepcid Complete   Sleep  Aids:  Prevacid      Ambien   Prilosec       Benadryl  Rolaids       Chamomile Tea  Tums (Limit 4/day)     Unisom  Tylenol PM         Warm milk-add vanilla or  Hemorrhoids:       Sugar for taste  Anusol/Anusol H.C.  (RX: Analapram 2.5%)  Sugar Substitutes:  Hydrocortisone OTC     Ok in moderation  Preparation H      Tucks        Vaseline lotion applied to tissue with wiping    Herpes:     Throat:  Acyclovir      Oragel  Famvir  Valtrex     Vaccines:         Flu Shot Leg Cramps:       *Gardasil  Benadryl      Hepatitis A         Hepatitis B Nasal Spray:       Pneumovax  Saline Nasal Spray     Polio Booster         Tetanus Nausea:       Tuberculosis test or PPD  Vitamin B6 25 mg TID   AVOID:    Dramamine      *Gardasil  Emetrol       Live Poliovirus  Ginger Root 250 mg QID    MMR (measles, mumps &  High Complex Carbs @ Bedtime    rebella)  Sea Bands-Accupressure    Varicella (Chickenpox)  Unisom 1/2 tab TID     *No known complications           If received before Pain:         Known pregnancy;   Darvocet       Resume series after  Lortab        Delivery  Percocet    Yeast:   Tramadol      Femstat  Tylenol 3      Gyne-lotrimin  Ultram       Monistat  Vicodin           MISC:         All Sunscreens           Hair Coloring/highlights          Insect Repellant's          (Including DEET)         Mystic Tans   Commonly Asked Questions During Pregnancy   Cats: A parasite can be excreted in cat feces.  To avoid exposure you need to have another person empty the little box.  If you must empty the litter box you will need to wear gloves.  Wash your hands after handling your cat.  This parasite can also be found in raw or undercooked meat so this should also be avoided.  Colds, Sore Throats, Flu: Please check your medication sheet to see what you can take for symptoms.  If your symptoms are unrelieved by these medications please call the office.  Dental Work: Most  any dental work Agricultural consultant recommends is permitted.  X-rays should only be taken during the first trimester if absolutely necessary.  Your abdomen should be shielded with a lead apron during all x-rays.  Please notify your provider prior to receiving any x-rays.  Novocaine is fine; gas is not recommended.  If your dentist requires a note from [place] prior to dental work please call the office and we will provide one for you.  Exercise: Exercise is an important part of staying healthy during your pregnancy.  You may continue most exercises you were accustomed to prior to pregnancy.  Later in your pregnancy you will most likely notice you have difficulty with activities requiring balance like riding a bicycle.  It is important that you listen to your body and avoid activities that put you at a higher risk of falling.  Adequate rest and staying well hydrated are a must!  If you have questions about the safety of specific activities ask your provider.    Exposure to Children with illness: Try to avoid obvious exposure; report any symptoms to [place] when noted,  If you have chicken pos, red measles or mumps, you should be immune to these diseases.   Please do not take any vaccines while pregnant unless you have checked with your OB provider.  Fetal Movement: After 28 weeks we recommend you do "kick counts" twice daily.  Lie or sit down in a calm quiet environment and count your baby movements "kicks".  You should feel your baby at least 10 times per hour.  If you have not felt 10 kicks within the first hour get up, walk around and have something sweet to eat or drink then repeat for an additional hour.  If count remains less than 10 per hour notify your provider.  Fumigating: Follow your pest control agent's advice as to how long to stay out of your home.  Ventilate the area well before re-entering.  Hemorrhoids:   Most over-the-counter preparations can be used during pregnancy.  Check your medication to see what is  safe to use.  It is important to use a stool softener or fiber in your diet and to drink lots of liquids.  If hemorrhoids seem to be getting worse please call the office.   Hot Tubs:  Hot tubs Jacuzzis and saunas are not recommended while pregnant.  These increase your internal body temperature and should be avoided.  Intercourse:  Sexual intercourse is safe during pregnancy as long as you are comfortable, unless otherwise advised by your provider.  Spotting may occur after intercourse; report any bright red bleeding that is heavier than spotting.  Labor:  If you know that you are in labor, please go to the hospital.  If you are unsure, please call the office and let us help you decide what to do.  Lifting, straining, etc:  If your job requires heavy lifting or straining please check with your provider for any limitations.  Generally, you should not lift items heavier than that you can lift simply with your hands and arms (no back muscles)  Painting:  Paint fumes do not harm your pregnancy, but may make you ill and should be avoided if possible.  Latex or water based paints have less odor than oils.  Use adequate ventilation while painting.  Permanents & Hair Color:  Chemicals in hair dyes are not recommended as they cause increase hair dryness which can increase hair loss during pregnancy.  " Highlighting" and permanents are allowed.  Dye may be absorbed differently and permanents may not hold as well during pregnancy.  Sunbathing:  Use a sunscreen, as skin burns easily during pregnancy.  Drink plenty of fluids; avoid over heating.  Tanning Beds:  Because their possible side effects are still unknown, tanning beds are not recommended.  Ultrasound Scans:  Routine ultrasounds are performed at approximately 20 weeks.  You will be able to see your baby's general anatomy an if you would like to know the gender this can usually be determined as well.  If it is questionable when you conceived you may  also  receive an ultrasound early in your pregnancy for dating purposes.  Otherwise ultrasound exams are not routinely performed unless there is a medical necessity.  Although you can request a scan we ask that you pay for it when conducted because insurance does not cover " patient request" scans.  Work: If your pregnancy proceeds without complications you may work until your due date, unless your physician or employer advises otherwise.  Round Ligament Pain/Pelvic Discomfort:  Sharp, shooting pains not associated with bleeding are fairly common, usually occurring in the second trimester of pregnancy.  They tend to be worse when standing up or when you remain standing for long periods of time.  These are the result of pressure of certain pelvic ligaments called "round ligaments".  Rest, Tylenol and heat seem to be the most effective relief.  As the womb and fetus grow, they rise out of the pelvis and the discomfort improves.  Please notify the office if your pain seems different than that described.  It may represent a more serious condition.

## 2024-01-23 NOTE — Progress Notes (Signed)
    NURSE VISIT NOTE  Subjective:    Patient ID: Kiara Reed, female    DOB: Jun 22, 1985, 38 y.o.   MRN: [id number]  HPI  Patient is a 38 y.o. [id number] female who presents for evaluation of amenorrhea. She believes she could be pregnant. Pregnancy is desired. Sexual Activity: single partner, contraception: none. Current symptoms also include: breast tenderness, fatigue, and positive home pregnancy test. Last period was normally heavy but this one was really light.    Objective:    BP 108/69   Pulse 90   Ht 5' 2 (1.575 m)   Wt 176 lb (79.8 kg)   LMP 12/18/2023 (Exact Date)   Breastfeeding No   BMI 32.19 kg/m   Lab Review  Results for orders placed or performed in visit on 01/23/24  POCT urine pregnancy  Result Value Ref Range   Preg Test, Ur Positive (A) Negative    Assessment:   1. Encounter for antenatal screening for uncertain dates   2. Amenorrhea     Plan:   Pregnancy Test: Positive  She will schedule her nurse visit @ 7-[redacted] wks pregnant, u/s for dating @10  wk, and NOB visit at [redacted] wk pregnant.        Mathis LITTIE Getting, CMA

## 2024-02-11 ENCOUNTER — Ambulatory Visit (INDEPENDENT_AMBULATORY_CARE_PROVIDER_SITE_OTHER)

## 2024-02-11 VITALS — BP 95/64 | HR 89 | Wt 178.2 lb

## 2024-02-11 DIAGNOSIS — Z3689 Encounter for other specified antenatal screening: Secondary | ICD-10-CM

## 2024-02-11 DIAGNOSIS — Z3480 Encounter for supervision of other normal pregnancy, unspecified trimester: Secondary | ICD-10-CM | POA: Insufficient documentation

## 2024-02-11 NOTE — Patient Instructions (Signed)
 First Trimester of Pregnancy  The first trimester of pregnancy starts on the first day of your last monthly period until the end of week 13. This is months 1 through 3 of pregnancy. A week after a sperm fertilizes an egg, the egg will implant into the wall of the uterus and begin to develop into a baby. Body changes during your first trimester Your body goes through many changes during pregnancy. The changes usually return to normal after your baby is born. Physical changes Your breasts may grow larger and may hurt. The area around your nipples may get darker. Your periods will stop. Your hair and nails may grow faster. You may pee more often. Health changes You may tire easily. Your gums may bleed and may be sensitive when you brush and floss. You may not feel hungry. You may have heartburn. You may throw up or feel like you may throw up. You may want to eat some foods, but not others. You may have headaches. You may have trouble pooping (constipation). Other changes Your emotions may change from day to day. You may have more dreams. Follow these instructions at home: Medicines Talk to your health care provider if you're taking medicines. Ask if the medicines are safe to take during pregnancy. Your provider may change the medicines that you take. Do not take any medicines unless told to by your provider. Take a prenatal vitamin that has at least 600 micrograms (mcg) of folic acid. Do not use herbal medicines, illegal substances, or medicines that are not approved by your provider. Eating and drinking While you're pregnant your body needs extra food for your growing baby. Talk with your provider about what to eat while pregnant. Activity Most women are able to exercise during pregnancy. Exercises may need to change as your pregnancy goes on. Talk to your provider about your activities and exercise routines. Relieving pain and discomfort Wear a good, supportive bra if your breasts  hurt. Rest with your legs raised if you have leg cramps or low back pain. Safety Wear your seatbelt at all times when you're in a car. Talk to your provider if someone hits you, hurts you, or yells at you. Talk with your provider if you're feeling sad or have thoughts of hurting yourself. Lifestyle Certain things can be harmful while you're pregnant. Follow these rules: Do not use hot tubs, steam rooms, or saunas. Do not douche. Do not use tampons or scented pads. Do not drink alcohol,smoke, vape, or use products with nicotine or tobacco in them. If you need help quitting, talk with your provider. Avoid cat litter boxes and soil used by cats. These things carry germs that can cause harm to your pregnancy and your baby. General instructions Keep all follow-up visits. It helps you and your unborn baby stay as healthy as possible. Write down your questions. Take them to your visits. Your provider will: Talk with you about your overall health. Give you advice or refer you to specialists who can help with different needs, including: Prenatal education classes. Mental health and counseling. Foods and healthy eating. Ask for help if you need help with food. Call your dentist and ask to be seen. Brush your teeth with a soft toothbrush. Floss gently. Where to find more information American Pregnancy Association: americanpregnancy.org [employer]: acog.org Office on [employer] Health: [place] Contact a health care provider if: You feel dizzy, faint, or have a fever. You vomit or have watery poop (diarrhea) for 2  days or more. You have abnormal discharge or bleeding from your vagina. You have pain when you pee or your pee smells bad. You have cramps, pain, or pressure in your belly area. Get help right away if: You have trouble breathing or chest pain. You have any kind of injury, such as from a fall or a car crash. These symptoms may be an  emergency. Get help right away. Call 911. Do not wait to see if the symptoms will go away. Do not drive yourself to the hospital. This information is not intended to replace advice given to you by your health care provider. Make sure you discuss any questions you have with your health care provider. Document Revised: 11/09/2022 Document Reviewed: 06/09/2022 Elsevier Patient Education  2024 [employer] Commonly Asked Questions During Pregnancy  Cats: A parasite can be excreted in cat feces.  To avoid exposure you need to have another person empty the little box.  If you must empty the litter box you will need to wear gloves.  Wash your hands after handling your cat.  This parasite can also be found in raw or undercooked meat so this should also be avoided.  Colds, Sore Throats, Flu: Please check your medication sheet to see what you can take for symptoms.  If your symptoms are unrelieved by these medications please call the office.  Dental Work: Most any dental work agricultural consultant recommends is permitted.  X-rays should only be taken during the first trimester if absolutely necessary.  Your abdomen should be shielded with a lead apron during all x-rays.  Please notify your provider prior to receiving any x-rays.  Novocaine is fine; gas is not recommended.  If your dentist requires a note from us  prior to dental work please call the office and we will provide one for you.  Exercise: Exercise is an important part of staying healthy during your pregnancy.  You may continue most exercises you were accustomed to prior to pregnancy.  Later in your pregnancy you will most likely notice you have difficulty with activities requiring balance like riding a bicycle.  It is important that you listen to your body and avoid activities that put you at a higher risk of falling.  Adequate rest and staying well hydrated are a must!  If you have questions about the safety of specific activities ask your provider.     Exposure to Children with illness: Try to avoid obvious exposure; report any symptoms to us  when noted,  If you have chicken pos, red measles or mumps, you should be immune to these diseases.   Please do not take any vaccines while pregnant unless you have checked with your OB provider.  Fetal Movement: After 28 weeks we recommend you do kick counts twice daily.  Lie or sit down in a calm quiet environment and count your baby movements kicks.  You should feel your baby at least 10 times per hour.  If you have not felt 10 kicks within the first hour get up, walk around and have something sweet to eat or drink then repeat for an additional hour.  If count remains less than 10 per hour notify your provider.  Fumigating: Follow your pest control agent's advice as to how long to stay out of your home.  Ventilate the area well before re-entering.  Hemorrhoids:   Most over-the-counter preparations can be used during pregnancy.  Check your medication to see what is safe to use.  It is important to use  a stool softener or fiber in your diet and to drink lots of liquids.  If hemorrhoids seem to be getting worse please call the office.   Hot Tubs:  Hot tubs Jacuzzis and saunas are not recommended while pregnant.  These increase your internal body temperature and should be avoided.  Intercourse:  Sexual intercourse is safe during pregnancy as long as you are comfortable, unless otherwise advised by your provider.  Spotting may occur after intercourse; report any bright red bleeding that is heavier than spotting.  Labor:  If you know that you are in labor, please go to the hospital.  If you are unsure, please call the office and let us  help you decide what to do.  Lifting, straining, etc:  If your job requires heavy lifting or straining please check with your provider for any limitations.  Generally, you should not lift items heavier than that you can lift simply with your hands and arms (no back  muscles)  Painting:  Paint fumes do not harm your pregnancy, but may make you ill and should be avoided if possible.  Latex or water based paints have less odor than oils.  Use adequate ventilation while painting.  Permanents & Hair Color:  Chemicals in hair dyes are not recommended as they cause increase hair dryness which can increase hair loss during pregnancy.   Highlighting and permanents are allowed.  Dye may be absorbed differently and permanents may not hold as well during pregnancy.  Sunbathing:  Use a sunscreen, as skin burns easily during pregnancy.  Drink plenty of fluids; avoid over heating.  Tanning Beds:  Because their possible side effects are still unknown, tanning beds are not recommended.  Ultrasound Scans:  Routine ultrasounds are performed at approximately 20 weeks.  You will be able to see your baby's general anatomy an if you would like to know the gender this can usually be determined as well.  If it is questionable when you conceived you may also receive an ultrasound early in your pregnancy for dating purposes.  Otherwise ultrasound exams are not routinely performed unless there is a medical necessity.  Although you can request a scan we ask that you pay for it when conducted because insurance does not cover  patient request scans.  Work: If your pregnancy proceeds without complications you may work until your due date, unless your physician or employer advises otherwise.  Round Ligament Pain/Pelvic Discomfort:  Sharp, shooting pains not associated with bleeding are fairly common, usually occurring in the second trimester of pregnancy.  They tend to be worse when standing up or when you remain standing for long periods of time.  These are the result of pressure of certain pelvic ligaments called round ligaments.  Rest, Tylenol and heat seem to be the most effective relief.  As the womb and fetus grow, they rise out of the pelvis and the discomfort improves.  Please  notify the office if your pain seems different than that described.  It may represent a more serious condition.   Common Medications Safe in Pregnancy  Acne:      Constipation:  Benzoyl Peroxide     Colace  Clindamycin      Dulcolax Suppository  Topica Erythromycin     Fibercon  Salicylic Acid      Metamucil         Miralax AVOID:        Senakot   Accutane    Cough:  Retin-A  Cough Drops  Tetracycline      Phenergan w/ Codeine if Rx  Minocycline      Robitussin (Plain & DM)  Antibiotics:     Crabs/Lice:  Ceclor       RID  Cephalosporins    AVOID:  E-Mycins      Kwell  Keflex  Macrobid/Macrodantin   Diarrhea:  Penicillin      Kao-Pectate  Zithromax      Imodium AD         PUSH FLUIDS AVOID:       Cipro     Fever:  Tetracycline      Tylenol (Regular or Extra  Minocycline       Strength)  Levaquin      Extra Strength-Do not          Exceed 8 tabs/24 hrs Caffeine:        200mg /day (equiv. To 1 cup of coffee or  approx. 3 12 oz sodas)         Gas: Cold/Hayfever:       Gas-X  Benadryl      Mylicon  Claritin       Phazyme  **Claritin-D        Chlor-Trimeton    Headaches:  Dimetapp      ASA-Free Excedrin  Drixoral-Non-Drowsy     Cold Compress  Mucinex (Guaifenasin)     Tylenol (Regular or Extra  Sudafed/Sudafed-12 Hour     Strength)  **Sudafed PE Pseudoephedrine   Tylenol Cold & Sinus     Vicks Vapor Rub  Zyrtec  **AVOID if Problems With Blood Pressure         Heartburn: Avoid lying down for at least 1 hour after meals  Aciphex      Maalox     Rash:  Milk of Magnesia     Benadryl    Mylanta       1% Hydrocortisone Cream  Pepcid  Pepcid Complete   Sleep Aids:  Prevacid      Ambien   Prilosec       Benadryl  Rolaids       Chamomile Tea  Tums (Limit 4/day)     Unisom         Tylenol PM         Warm milk-add vanilla or  Hemorrhoids:       Sugar for taste  Anusol/Anusol H.C.  (RX: Analapram 2.5%)  Sugar Substitutes:  Hydrocortisone OTC     Ok in  moderation  Preparation H      Tucks        Vaseline lotion applied to tissue with wiping    Herpes:     Throat:  Acyclovir      Oragel  Famvir  Valtrex     Vaccines:         Flu Shot Leg Cramps:       *Gardasil  Benadryl      Hepatitis A         Hepatitis B Nasal Spray:       Pneumovax  Saline Nasal Spray     Polio Booster         Tetanus Nausea:       Tuberculosis test or PPD  Vitamin B6 25 mg TID   AVOID:    Dramamine      *Gardasil  Emetrol       Live Poliovirus  Ginger Root 250 mg QID    MMR (measles, mumps &  High Complex Carbs @ Bedtime    rebella)  Sea Bands-Accupressure    Varicella (Chickenpox)  Unisom 1/2 tab TID     *No known complications           If received before Pain:         Known pregnancy;   Darvocet       Resume series after  Lortab        Delivery  Percocet    Yeast:   Tramadol      Femstat  Tylenol 3      Gyne-lotrimin  Ultram       Monistat  Vicodin           MISC:         All Sunscreens           Hair Coloring/highlights          Insect Repellant's          (Including DEET)         Mystic Tans  Tests and Screening During Pregnancy Tests and screenings during pregnancy are an important part of your prenatal care. These tests help your health care provider find any problems that might affect your pregnancy. Some tests need to be done for all pregnant people, and some are optional. Most of the tests and screenings do not pose any risks for you or your baby. You may need more testing if a test result shows there is a risk to your health or your baby's health. Tests and screenings done early in pregnancy Some tests and screenings you may have in early pregnancy are: Blood tests, such as: Complete blood count (CBC). Blood typing. Tests to check for diseases that can cause birth defects or can be passed to your baby, such as: German measles (rubella( and chicken pox. Hepatitis B and C. Human Immunodeficiency Virus (HIV). Syphilis. Zika  virus. Pee tests. Blood pressure. Testing for sexually transmitted infections (STIs), such as chlamydia or gonorrhea. Testing for tuberculosis. Ultrasound. Tests and screenings done later in pregnancy Some common tests you can expect to have later in pregnancy include: Rh antibody testing. Pee and blood tests. Glucose screening. This checks your blood sugar. It will show whether you are developing the type of diabetes that happens during pregnancy, called gestational diabetes. You may have this screening earlier if you have risk factors for diabetes. Ultrasound. This may be repeated at 16-20 weeks to check how your baby is growing. Screening for group B streptococcus (GBS). GBS is a type of bacteria that may live in your rectum or vagina. GBS can spread to your baby during birth. This test is done at 35-37 weeks of pregnancy. Non-stress test. This may be done more often if your pregnancy is high risk. Biophysical profile. This test includes ultrasound imaging and a non-stress test to check to see if your baby is healthy. This test may help decide when your baby should be born. Screening for birth defects Early in your pregnancy, tests can be done to find out if your baby is at risk for a genetic disorder. This testing is optional. The type of testing recommended for you will depend on your family and medical history, your ethnicity, and your age. Testing may include: Screening tests such as ultrasound, blood tests, or a combination of both. Carrier screening. If genetic screening shows that your baby is at risk for a genetic defect, diagnostic testing may be recommended, such as: Amniocentesis. Chorionic villus sampling. Unlike other tests done  during pregnancy, diagnostic testing does have some risk for your pregnancy. Talk to your provider about the risks and benefits of genetic testing. Questions to ask your health care provider What tests are recommended for me? When and how will these  tests be done? When will I get the results of the tests? What do the results of these tests mean for me or my baby? Do you recommend any genetic screening tests? Which ones? Should I see a genetic counselor before having genetic screening? Where to find more information Go to americanpregnancy.org Click on search. Type 'prenatal tests in the search box. Go to [place] Click on search. Type 'prenatal tests in the search box. Go to acog.org Click on search. Type routine tests in the search box. This information is not intended to replace advice given to you by your health care provider. Make sure you discuss any questions you have with your health care provider. Document Revised: 12/05/2022 Document Reviewed: 12/05/2022 Elsevier Patient Education  2025 [employer]. Questions to Ask Your Health Care Provider During Pregnancy  During pregnancy, you'll go through many changes. These will affect your body as well as your feelings and emotions. And you'll likely have a lot of questions. Your health care team is a good source for reliable answers. Make an appointment with your team if you're planning to get pregnant or as soon as you know that you're pregnant. New questions will come up as your pregnancy continues. Write your questions down and take them with you to your prenatal visits. Questions to ask about pregnancy Prenatal visits and tests How often will I have my prenatal visits? Should I visit the dentist during pregnancy? What type of screening tests should I consider? What type of routine tests are suggested and when are they done? What are the risks and benefits of these tests? When would you recommend an ultrasound, and what will it show? Is there a nurse line or office line I can call if I have questions? Caring for yourself during pregnancy How much weight should I gain? What kind of exercise should I get and how much? How much sleep should I get? What kinds of  things can I do to help with stress and anxiety? Are there any travel restrictions? Is it OK to have sex? Eating and drinking during pregnancy What vitamins or supplements do you recommend? When should I start taking my prenatal vitamins? What's a healthy diet for me during pregnancy? What foods should I eat? What foods should I not eat? What if I'm on a special diet? Should I limit how much caffeine I have? Vaccinations What shots should I get? When is the best time to get them? Are there shots I should not get? Medicine and substance use during pregnancy Are my current medicines OK to keep taking? Which medicines could hurt me or my baby? Which supplements or herbal medicines could hurt me or my baby? Why is it important not to smoke or drink alcohol? What about other drugs or substances? Where can I get help if I'm having a hard time quitting? Questions to ask about labor and birth Getting ready What are my birth options? Should I make a birth plan? Where can I have my baby? Is a birth center or home birth an option for me? What are the benefits of breastfeeding? When should I start preparing for breastfeeding? Classes Are there breastfeeding classes or support groups? Where can I find childbirth classes? Pain relief What is natural childbirth?  What can I do to decrease pain during labor and birth? What are other methods to help with pain during labor and birth? Birth What is induced labor? What's an episiotomy, and when might I need one? How can I increase my chance for a vaginal birth? When would a C-section be advised? Can I have a vaginal birth if I had a C-section in the past? Other questions to ask How long will I need to stay in the hospital after giving birth? How soon can I get pregnant after giving birth? What are my birth control options? What are the signs of perinatal depression? What should I do if I have depression or anxiety that's getting worse? This  information is not intended to replace advice given to you by your health care provider. Make sure you discuss any questions you have with your health care provider. Document Revised: 10/31/2022 Document Reviewed: 10/31/2022 Elsevier Patient Education  2024 [employer]. How a Baby Grows During Pregnancy Pregnancy starts when a fertilized egg attaches to the lining of the uterus and begins to grow. It is a time of many changes in the mother's body. The changes happen: To support your pregnancy. To help the baby grow. To prepare for the birth of your baby. How long does a pregnancy last? A pregnancy usually lasts 280 days, or about 40 weeks. Pregnancy is divided into three periods of growth, also called trimesters: First trimester: weeks 0-13. Second trimester: weeks 14-27. Third trimester: weeks 28-40. The end of the 40 weeks of pregnancy is your likely date of delivery, or due date. However, most babies are not born on their due date. How does my baby develop month by month?  First and second month The brain, spinal cord, and heart begin to develop. By 6 weeks, the heart begins to beat. The face, arms, and legs begin to form. Then the hands and feet begin to develop. All major organs begin to develop by the end of the second month. Third month All of the internal organs are forming. Bones and muscles are beginning to grow. The fetus is making movements similar to breathing. Fingernails and toenails are forming. Fourth month The skin is thin and transparent. The neck, outer ear, eyelids, and fingernails are formed. The external sex organs are formed. The fetus can hear, swallow, and move easily. The kidneys begin to make pee (urine). Fifth month The fetus moves around more and can be felt for the first time (quickening). The face, nose, and lips can be seen easily on ultrasound. The baby is covered with soft hair called lanugo. The organs in the digestive system work. Sixth  month The lungs continue to grow and mature. The eyes open. The brain continues to develop. The fetus may begin to suck a finger. Skin ridges are formed that will be fingerprints and toe prints. Hair grows thicker and eyebrows can be seen. Seventh month Lungs are fully developed but not yet ready for birth. Eyes are developed enough to sense changes in light. The fetus responds to sound. The fetus kicks and stretches. Hands can make a grasping motion. Vernix, a waxy coating, is starting to develop to protect the skin. Eighth month Most organs and body systems are fully developed and working. Bones harden, and taste buds develop. The fetus may hiccup. The brain is still developing. The skull remains soft. By week 31, most development is complete and the fetus is gaining weight fast. By the end of week 32, the fetus weighs a  little more than 4 pounds (1.8 kg). Ninth month until your due date The lungs are fully developed and ready for birth. Patterns of sleep develop. The fetus weighs around 6 pounds at the beginning of the ninth month and may weigh around 8 pounds by your due date. The fetus's head typically moves into a head-down position in the uterus. Closer to your due date, the fetus's head may drop lower in your hips. How do I know if my baby is developing well? Always talk with your health care provider about any concerns that you may have about your pregnancy and your baby. At prenatal visits, your provider will do tests to check on your health and keep track of your baby's growth. These include: Fundal height and position. To do this, your provider will: Measure your growing belly from your pubic bone to the top of the uterus using a tape measure. Feel your belly to determine your baby's position. Heartbeat. An ultrasound in the first trimester can confirm pregnancy and show a heartbeat, depending on how far along you are. Your provider will check your baby's heart rate at  prenatal visits. You may also have a second trimester ultrasound to check your baby's development. Follow these instructions at home: Take your medicines as told. Take prenatal vitamins as told by your provider. These include vitamins such as folic acid, iron, calcium, and vitamin D. They are important for healthy development of your baby. Keep all follow-up visits. These include prenatal care and screening tests to check your health and your baby's health. This information is not intended to replace advice given to you by your health care provider. Make sure you discuss any questions you have with your health care provider. Document Revised: 07/10/2022 Document Reviewed: 07/10/2022 Elsevier Patient Education  2024 [employer].

## 2024-02-11 NOTE — Progress Notes (Signed)
 New OB Intake  I met with  Kiara Reed on 02/11/2024 at  9:15 AM EST in person. Nurse and patient are located at [facility].  I explained I am completing New OB Intake today. We discussed her EDD of 09/23/24 that is based on LMP of 12/18/23. Pt is G4/P1021. I reviewed her allergies, medications, Medical/Surgical/OB history, and appropriate screenings. There are cats in the home: no.  Based on history, this is a/an pregnancy complicated by SVT . Her obstetrical history is significant for None.  Patient Active Problem List   Diagnosis Date Noted   Supervision of other normal pregnancy, antepartum 02/11/2024   SVT (supraventricular tachycardia) 01/23/2019   PCOS (polycystic ovarian syndrome) 10/11/2017    Concerns addressed today: Patient strongly desires to be delivered at 39 weeks (last week of July) due to husband being out of town first week of August. She expressed high anxiety related to this issue.  Delivery Plans:  Plans to deliver at [facility].  Anatomy US  Explained first scheduled US  will be 02/27/24. Anatomy US  will be scheduled around [redacted] weeks gestational age.  Labs Discussed genetic screening with patient. Patient desires genetic testing to be drawn at new OB visit. Discussed possible labs to be drawn at new OB appointment.  COVID Vaccine Patient has not had COVID vaccine.   Social Determinants of Health Food Insecurity: denies food insecurity WIC Referral: Patient is not interested in referral to [facility].  Transportation: Patient denies transportation needs. Childcare: Discussed no children allowed at ultrasound appointments.   First visit review I reviewed new OB appt with pt. I explained she will have blood work and pap smear/pelvic exam if indicated. Explained pt will be seen by Zelda Hummer, CNM at first visit; encounter routed to appropriate provider.   [place], [place] 87/77/7974  10:09 AM

## 2024-02-27 ENCOUNTER — Ambulatory Visit (INDEPENDENT_AMBULATORY_CARE_PROVIDER_SITE_OTHER)

## 2024-02-27 DIAGNOSIS — O3680X0 Pregnancy with inconclusive fetal viability, not applicable or unspecified: Secondary | ICD-10-CM | POA: Diagnosis not present

## 2024-02-27 DIAGNOSIS — N912 Amenorrhea, unspecified: Secondary | ICD-10-CM

## 2024-02-27 DIAGNOSIS — Z3A09 9 weeks gestation of pregnancy: Secondary | ICD-10-CM | POA: Diagnosis not present

## 2024-02-27 DIAGNOSIS — Z3687 Encounter for antenatal screening for uncertain dates: Secondary | ICD-10-CM

## 2024-03-11 NOTE — Progress Notes (Unsigned)
 NEW OB HISTORY AND PHYSICAL  SUBJECTIVE:       Kiara Reed is a 39 y.o. G1P1021 female, Patient's last menstrual period was 12/18/2023 (exact date)., Estimated Date of Delivery: 09/23/24, [redacted]w[redacted]d, presents today for establishment of Prenatal Care. She reports fatigue. Planned, desired pregnancy. Stopped GLP1 with +UPT. Concerned about timing of birth, her 12yo son has court mandated visitation with his bio mom over the summer which will require her husband to travel with him to [place] , strongly desires 39w IOL to avoid husband's absence at birth.   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[employer], also working PT as same for 2 other businesses Exercise: active Substance use: no T/E/D  Indications for ASA therapy (per [employer] corporation) One of the following: Previous pregnancy with preeclampsia, especially early onset and with an adverse outcome No Multifetal gestation No Chronic hypertension No Type 1 or 2 diabetes mellitus No Chronic kidney disease No Autoimmune disease (antiphospholipid syndrome, systemic lupus erythematosus) No  Two or more of the following: Nulliparity No Obesity (body mass index >30 kg/m2) Yes Family history of preeclampsia in mother or sister No Age >=35 years Yes Sociodemographic characteristics (African American race, low socioeconomic level) No Personal risk factors (eg, previous pregnancy with low birth weight or small for gestational age infant, previous adverse pregnancy outcome [eg, stillbirth], interval >10 years between pregnancies) No Daily 81mg  ASA recommended  Gynecologic History Patient's last menstrual period was 12/18/2023 (exact date). Normal Contraception: none Last Pap:     Component Value Date/Time   DIAGPAP  10/27/2022 1049    - Negative for intraepithelial lesion or malignancy (NILM)   DIAGPAP  01/10/2019 0844    - Negative for  intraepithelial lesion or malignancy (NILM)   HPVHIGH Negative 10/27/2022 1049   ADEQPAP  10/27/2022 1049    Satisfactory for evaluation; transformation zone component PRESENT.   ADEQPAP Satisfactory for evaluation.  The absence of an 01/10/2019 0844   ADEQPAP  01/10/2019 0844    endocervical/transformation zone component is not uncommon in pregnant   ADEQPAP patients. 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   Follow up postpartum     COVID-19 affecting pregnancy in third trimester 08/04/2019   Palpitations 01/23/2019   SOB (shortness of breath)    SVT (supraventricular tachycardia)     Past Surgical History:  Procedure Laterality Date   BREAST BIOPSY Right 11/07/2022   ribbon clip/ path pending   BREAST BIOPSY Right 11/07/2022   US  RT BREAST BX W LOC DEV 1ST LESION IMG BX SPEC US  GUIDE 11/07/2022 [facility]   SVT ABLATION N/A 12/24/2019   Procedure: SVT ABLATION;  Surgeon: Waddell Danelle ORN, MD;  Location: [facility] INVASIVE CV LAB;  Service: Cardiovascular;  Laterality: N/A;   TONSILLECTOMY AND ADENOIDECTOMY  Medications Ordered Prior to Encounter[1]  Allergies[2]  Social History   Socioeconomic History   Marital status: Married    Spouse name: Micheal   Number of children: 2   Years of education: 12   Highest education level: High school graduate  Occupational History   Occupation: Nature Conservation Officer  Tobacco Use   Smoking status: Never   Smokeless tobacco: Never  Vaping Use   Vaping status: Never Used  Substance and Sexual Activity   Alcohol use:  Never   Drug use: Never   Sexual activity: Yes    Partners: Male  Other Topics Concern   Not on file  Social History Narrative   Not on file   Social Drivers of Health   Tobacco Use: Low Risk (02/11/2024)   Patient History    Smoking Tobacco Use: Never    Smokeless Tobacco Use: Never    Passive Exposure: Not on file  Financial Resource Strain: Low Risk  (05/07/2023)   [facility]   Overall Financial Resource Strain (CARDIA)    Difficulty of Paying Living Expenses: Not very hard  Food Insecurity: No Food Insecurity (05/07/2023)   Received from [facility]   Epic    Within the past 12 months, you worried that your food would run out before you got the money to buy more.: Never true    Within the past 12 months, the food you bought just didn't last and you didn't have money to get more.: Never true  Transportation Needs: No Transportation Needs (05/07/2023)   Received from [facility] - Transportation    In the past 12 months, has lack of transportation kept you from medical appointments or from getting medications?: No    Lack of Transportation (Non-Medical): No  Physical Activity: Not on file  Stress: Not on file  Social Connections: Not on file  Intimate Partner Violence: Not At Risk (02/11/2024)   Epic    Fear of Current or Ex-Partner: No    Emotionally Abused: No    Physically Abused: No    Sexually Abused: No  Depression (PHQ2-9): Not on file  Alcohol Screen: Not on file  Housing: Unknown (05/07/2023)   Received from [facility]   Epic    In the last 12 months, was there a time when you were not able to pay the mortgage or rent on time?: No    Number of Times Moved in the Last Year: Not on file    At any time in the past 12 months, were you homeless or living in a shelter (including now)?: No  Utilities: Not At Risk (05/07/2023)   Received from [facility] Utilities    Threatened with loss of utilities: No  Health Literacy: Adequate Health Literacy (02/11/2024)   B1300 Health Literacy    Frequency of need for help with medical instructions: Never    Family History  Problem Relation Age of Onset   Bipolar disorder Mother    Cancer - Other Mother    Vaginal cancer Mother 11   Melanoma Father    COPD Maternal Grandmother    Leukemia Paternal Aunt     The following portions of the patient's history were reviewed and updated as appropriate: allergies, current medications, past OB history, past medical history, past surgical history, past family history, past social history, and problem list.  Constitutional: Denied constitutional symptoms, night sweats, recent illness,  fever, insomnia and weight loss.  Eyes: Denied eye symptoms, eye pain, photophobia, vision change and visual disturbance.  Ears/Nose/Throat/Neck: Denied ear, nose, throat or neck symptoms, hearing loss, nasal discharge, sinus congestion and sore throat.  Cardiovascular: Denied cardiovascular symptoms, arrhythmia, chest pain/pressure, edema, exercise intolerance, orthopnea and palpitations.  Respiratory: Denied pulmonary symptoms, asthma, pleuritic pain, productive sputum, cough, dyspnea and wheezing.  Gastrointestinal: Denied gastro-esophageal reflux, melena, nausea and vomiting.  Genitourinary: Denied genitourinary symptoms including symptomatic vaginal discharge, pelvic relaxation issues, and urinary complaints.  Musculoskeletal: Denied musculoskeletal symptoms, stiffness, swelling, muscle weakness and myalgia.  Dermatologic: Denied dermatology symptoms, rash and scar.  Neurologic: Denied neurology symptoms, dizziness, headache, neck pain and syncope.  Psychiatric: Denied psychiatric symptoms, anxiety and depression.  Endocrine: Denied endocrine symptoms including hot flashes and night sweats.     OBJECTIVE: Initial Physical Exam (New OB) BP (!) 135/52   Pulse 83    Wt 194 lb 12.8 oz (88.4 kg)   LMP 12/18/2023 (Exact Date)   BMI 35.63 kg/m  Physical Exam Vitals reviewed.  Constitutional:      General: She is not in acute distress.    Appearance: Normal appearance.  HENT:     Head: Normocephalic.  Neck:     Thyroid : No thyroid  mass or thyromegaly.  Cardiovascular:     Rate and Rhythm: Normal rate and regular rhythm.     Heart sounds: Normal heart sounds.  Pulmonary:     Effort: Pulmonary effort is normal.     Breath sounds: Normal breath sounds.  Chest:  Breasts:    Tanner Score is 5.     Right: No inverted nipple, mass or tenderness.     Left: No inverted nipple, mass or tenderness.  Abdominal:     Palpations: Abdomen is soft.     Tenderness: There is no abdominal tenderness.  Musculoskeletal:     Cervical back: Neck supple. No tenderness.  Skin:    General: Skin is warm and dry.  Neurological:     General: No focal deficit present.     Mental Status: She is alert and oriented to person, place, and time.  Psychiatric:        Mood and Affect: Mood and affect normal.        Behavior: Behavior normal. Behavior is cooperative.     Fetal Heart Rate (bpm): 135  ASSESSMENT: Normal pregnancy   PLAN: Routine prenatal care. We discussed an overview of prenatal care and when to call. Reviewed diet, exercise, and weight gain recommendations in pregnancy. Discussed benefits of breastfeeding and lactation resources at [facility]. I reviewed labs and answered all questions.  1. Supervision of other normal pregnancy, antepartum (Primary) - NOB Panel; Future - Culture, OB Urine - Monitor Drug Profile 14(MW) - Nicotine screen, urine - Urinalysis, Routine w reflex microscopic - Comprehensive metabolic panel - Hemoglobin A1c - Protein / creatinine ratio, urine - TSH + free T4 - Cervicovaginal ancillary only  2. [redacted] weeks gestation of pregnancy  3. Antepartum multigravida of advanced maternal age - NOB Panel; Future - Culture, OB Urine -  Monitor Drug Profile 14(MW) - Nicotine screen, urine - Urinalysis, Routine w reflex microscopic - Comprehensive metabolic panel - Hemoglobin A1c - Protein / creatinine ratio, urine - TSH + free T4 - Cervicovaginal ancillary only  4. Antenatal screening encounter - NOB Panel; Future - Culture, OB Urine - Monitor Drug Profile 14(MW) - Nicotine screen, urine - Urinalysis, Routine w reflex microscopic - Comprehensive metabolic panel - Hemoglobin A1c -  Protein / creatinine ratio, urine - TSH + free T4 - Cervicovaginal ancillary only  5. Screening for human immunodeficiency virus - NOB Panel; Future  6. Immunity status testing - NOB Panel; Future  7. Genetic screening - NOB Panel; Future  8. Screen for sexually transmitted diseases - NOB Panel; Future  9. Screening for thyroid  disorder - TSH + free T4  10. Supervision of elderly multigravida in second trimester - PANORAMA PRENATAL TEST  11. History of congenital anomaly - US  MFM OB DETAIL +14 WK; Future  12. Encounter for routine screening for malformation using ultrasonics - US  MFM OB DETAIL +14 WK; Future  13. SVT (supraventricular tachycardia)   Harlene LITTIE Cisco, CNM     [1]  Current Outpatient Medications on File Prior to Visit  Medication Sig Dispense Refill   letrozole  (FEMARA ) 2.5 MG tablet Take 1 pill daily at the same time for 5 days starting on day #3 of menstrual cycle (Patient not taking: Reported on 02/11/2024) 15 tablet 2   metoprolol  tartrate (LOPRESSOR ) 50 MG tablet Take 1 tablet (50 mg total) by mouth as needed (palpitations). 30 tablet 2   Prenatal Vit-Fe Fumarate-FA (MULTIVITAMIN-PRENATAL) 27-0.8 MG TABS tablet Take 1 tablet by mouth daily at 12 noon.     valACYclovir (VALTREX) 1000 MG tablet Take 1,000 mg by mouth as needed.     acetaminophen  (TYLENOL ) 325 MG tablet Take 650 mg by mouth every 6 (six) hours as needed (pain.). (Patient not taking: Reported on 02/11/2024)      medroxyPROGESTERone  (PROVERA ) 10 MG tablet Take 1 tablet (10 mg total) by mouth daily. Use for ten days each month (Patient not taking: Reported on 02/11/2024) 30 tablet 2   metoCLOPramide  (REGLAN ) 10 MG tablet Take 1 tablet (10 mg total) by mouth 3 (three) times daily with meals. (Patient not taking: Reported on 02/11/2024) 90 tablet 1   No current facility-administered medications on file prior to visit.  [2]  Allergies Allergen Reactions   Latex Rash

## 2024-03-12 ENCOUNTER — Encounter: Admitting: Certified Nurse Midwife

## 2024-03-14 ENCOUNTER — Other Ambulatory Visit (HOSPITAL_COMMUNITY)
Admission: RE | Admit: 2024-03-14 | Discharge: 2024-03-14 | Disposition: A | Source: Ambulatory Visit | Attending: Certified Nurse Midwife | Admitting: Certified Nurse Midwife

## 2024-03-14 ENCOUNTER — Ambulatory Visit (INDEPENDENT_AMBULATORY_CARE_PROVIDER_SITE_OTHER): Admitting: Certified Nurse Midwife

## 2024-03-14 VITALS — BP 135/52 | HR 83 | Wt 194.8 lb

## 2024-03-14 DIAGNOSIS — O99411 Diseases of the circulatory system complicating pregnancy, first trimester: Secondary | ICD-10-CM

## 2024-03-14 DIAGNOSIS — Z87798 Personal history of other (corrected) congenital malformations: Secondary | ICD-10-CM | POA: Insufficient documentation

## 2024-03-14 DIAGNOSIS — O09291 Supervision of pregnancy with other poor reproductive or obstetric history, first trimester: Secondary | ICD-10-CM | POA: Diagnosis not present

## 2024-03-14 DIAGNOSIS — L309 Dermatitis, unspecified: Secondary | ICD-10-CM | POA: Insufficient documentation

## 2024-03-14 DIAGNOSIS — I471 Supraventricular tachycardia, unspecified: Secondary | ICD-10-CM

## 2024-03-14 DIAGNOSIS — Z3A12 12 weeks gestation of pregnancy: Secondary | ICD-10-CM | POA: Diagnosis not present

## 2024-03-14 DIAGNOSIS — Z363 Encounter for antenatal screening for malformations: Secondary | ICD-10-CM

## 2024-03-14 DIAGNOSIS — Z3480 Encounter for supervision of other normal pregnancy, unspecified trimester: Secondary | ICD-10-CM | POA: Insufficient documentation

## 2024-03-14 DIAGNOSIS — O09521 Supervision of elderly multigravida, first trimester: Secondary | ICD-10-CM

## 2024-03-14 DIAGNOSIS — Z1379 Encounter for other screening for genetic and chromosomal anomalies: Secondary | ICD-10-CM

## 2024-03-14 DIAGNOSIS — O09529 Supervision of elderly multigravida, unspecified trimester: Secondary | ICD-10-CM | POA: Insufficient documentation

## 2024-03-14 DIAGNOSIS — Z0184 Encounter for antibody response examination: Secondary | ICD-10-CM

## 2024-03-14 DIAGNOSIS — Z369 Encounter for antenatal screening, unspecified: Secondary | ICD-10-CM

## 2024-03-14 DIAGNOSIS — Z114 Encounter for screening for human immunodeficiency virus [HIV]: Secondary | ICD-10-CM

## 2024-03-14 DIAGNOSIS — Z1329 Encounter for screening for other suspected endocrine disorder: Secondary | ICD-10-CM

## 2024-03-14 DIAGNOSIS — O09522 Supervision of elderly multigravida, second trimester: Secondary | ICD-10-CM

## 2024-03-14 DIAGNOSIS — F4329 Adjustment disorder with other symptoms: Secondary | ICD-10-CM | POA: Insufficient documentation

## 2024-03-14 DIAGNOSIS — Z113 Encounter for screening for infections with a predominantly sexual mode of transmission: Secondary | ICD-10-CM

## 2024-03-15 LAB — URINALYSIS, ROUTINE W REFLEX MICROSCOPIC
Bilirubin, UA: NEGATIVE
Glucose, UA: NEGATIVE
Ketones, UA: NEGATIVE
Nitrite, UA: NEGATIVE
Protein,UA: NEGATIVE
RBC, UA: NEGATIVE
Specific Gravity, UA: 1.02 (ref 1.005–1.030)
Urobilinogen, Ur: 1 mg/dL (ref 0.2–1.0)
pH, UA: 7 (ref 5.0–7.5)

## 2024-03-15 LAB — COMPREHENSIVE METABOLIC PANEL WITH GFR
ALT: 17 [IU]/L (ref 0–32)
AST: 18 [IU]/L (ref 0–40)
Albumin: 4.2 g/dL (ref 3.9–4.9)
Alkaline Phosphatase: 48 [IU]/L (ref 41–116)
BUN/Creatinine Ratio: 22 (ref 9–23)
BUN: 11 mg/dL (ref 6–20)
Bilirubin Total: 0.2 mg/dL (ref 0.0–1.2)
CO2: 18 mmol/L — ABNORMAL LOW (ref 20–29)
Calcium: 9.5 mg/dL (ref 8.7–10.2)
Chloride: 102 mmol/L (ref 96–106)
Creatinine, Ser: 0.5 mg/dL — ABNORMAL LOW (ref 0.57–1.00)
Globulin, Total: 2.3 g/dL (ref 1.5–4.5)
Glucose: 80 mg/dL (ref 70–99)
Potassium: 4.1 mmol/L (ref 3.5–5.2)
Sodium: 138 mmol/L (ref 134–144)
Total Protein: 6.5 g/dL (ref 6.0–8.5)
eGFR: 125 mL/min/{1.73_m2}

## 2024-03-15 LAB — CERVICOVAGINAL ANCILLARY ONLY
Chlamydia: NEGATIVE
Comment: NEGATIVE
Comment: NORMAL
Neisseria Gonorrhea: NEGATIVE

## 2024-03-15 LAB — MICROSCOPIC EXAMINATION
Bacteria, UA: NONE SEEN
Casts: NONE SEEN /LPF
RBC, Urine: NONE SEEN /HPF (ref 0–2)
WBC, UA: NONE SEEN /HPF (ref 0–5)

## 2024-03-15 LAB — TSH+FREE T4
Free T4: 1.22 ng/dL (ref 0.82–1.77)
TSH: 1.54 u[IU]/mL (ref 0.450–4.500)

## 2024-03-15 LAB — HEMOGLOBIN A1C
Est. average glucose Bld gHb Est-mCnc: 100 mg/dL
Hgb A1c MFr Bld: 5.1 % (ref 4.8–5.6)

## 2024-03-15 LAB — PROTEIN / CREATININE RATIO, URINE
Creatinine, Urine: 83.7 mg/dL
Protein, Ur: 5.8 mg/dL
Protein/Creat Ratio: 69 mg/g{creat} (ref 0–200)

## 2024-03-17 LAB — URINE CULTURE, OB REFLEX

## 2024-03-17 LAB — CULTURE, OB URINE

## 2024-03-18 ENCOUNTER — Ambulatory Visit: Payer: Self-pay | Admitting: Certified Nurse Midwife

## 2024-03-18 LAB — MONITOR DRUG PROFILE 14(MW)
Amphetamine Scrn, Ur: NEGATIVE ng/mL
BARBITURATE SCREEN URINE: NEGATIVE ng/mL
BENZODIAZEPINE SCREEN, URINE: NEGATIVE ng/mL
Buprenorphine, Urine: NEGATIVE ng/mL
CANNABINOIDS UR QL SCN: NEGATIVE ng/mL
Cocaine (Metab) Scrn, Ur: NEGATIVE ng/mL
Creatinine(Crt), U: 86.5 mg/dL (ref 20.0–300.0)
Fentanyl, Urine: NEGATIVE pg/mL
Meperidine Screen, Urine: NEGATIVE ng/mL
Methadone Screen, Urine: NEGATIVE ng/mL
OXYCODONE+OXYMORPHONE UR QL SCN: NEGATIVE ng/mL
Opiate Scrn, Ur: NEGATIVE ng/mL
Ph of Urine: 7 (ref 4.5–8.9)
Phencyclidine Qn, Ur: NEGATIVE ng/mL
Propoxyphene Scrn, Ur: NEGATIVE ng/mL
SPECIFIC GRAVITY: 1.02
Tramadol Screen, Urine: NEGATIVE ng/mL

## 2024-03-18 LAB — NICOTINE SCREEN, URINE: Cotinine Ql Scrn, Ur: NEGATIVE ng/mL

## 2024-03-28 ENCOUNTER — Other Ambulatory Visit

## 2024-03-28 DIAGNOSIS — Z0184 Encounter for antibody response examination: Secondary | ICD-10-CM

## 2024-03-28 DIAGNOSIS — O09529 Supervision of elderly multigravida, unspecified trimester: Secondary | ICD-10-CM

## 2024-03-28 DIAGNOSIS — Z3480 Encounter for supervision of other normal pregnancy, unspecified trimester: Secondary | ICD-10-CM

## 2024-03-28 DIAGNOSIS — Z113 Encounter for screening for infections with a predominantly sexual mode of transmission: Secondary | ICD-10-CM

## 2024-03-28 DIAGNOSIS — Z1379 Encounter for other screening for genetic and chromosomal anomalies: Secondary | ICD-10-CM

## 2024-03-28 DIAGNOSIS — Z114 Encounter for screening for human immunodeficiency virus [HIV]: Secondary | ICD-10-CM

## 2024-03-28 DIAGNOSIS — Z369 Encounter for antenatal screening, unspecified: Secondary | ICD-10-CM

## 2024-04-10 ENCOUNTER — Encounter: Admitting: Obstetrics

## 2024-04-30 ENCOUNTER — Other Ambulatory Visit
